# Patient Record
Sex: Male | Born: 1950
Health system: Southern US, Community
[De-identification: ages and names within clinical notes are randomized; demographics above are authoritative.]

## PROBLEM LIST (undated history)

## (undated) DIAGNOSIS — K439 Ventral hernia without obstruction or gangrene: Secondary | ICD-10-CM

## (undated) DIAGNOSIS — B351 Tinea unguium: Secondary | ICD-10-CM

## (undated) DIAGNOSIS — E785 Hyperlipidemia, unspecified: Secondary | ICD-10-CM

## (undated) DIAGNOSIS — I10 Essential (primary) hypertension: Secondary | ICD-10-CM

## (undated) DIAGNOSIS — M65311 Trigger thumb, right thumb: Secondary | ICD-10-CM

## (undated) DIAGNOSIS — G4733 Obstructive sleep apnea (adult) (pediatric): Secondary | ICD-10-CM

## (undated) DIAGNOSIS — S92309A Fracture of unspecified metatarsal bone(s), unspecified foot, initial encounter for closed fracture: Secondary | ICD-10-CM

## (undated) DIAGNOSIS — K219 Gastro-esophageal reflux disease without esophagitis: Secondary | ICD-10-CM

## (undated) DIAGNOSIS — Z9989 Dependence on other enabling machines and devices: Secondary | ICD-10-CM

## (undated) DIAGNOSIS — M1990 Unspecified osteoarthritis, unspecified site: Secondary | ICD-10-CM

## (undated) DIAGNOSIS — J309 Allergic rhinitis, unspecified: Secondary | ICD-10-CM

## (undated) DIAGNOSIS — R0789 Other chest pain: Secondary | ICD-10-CM

## (undated) DIAGNOSIS — I2510 Atherosclerotic heart disease of native coronary artery without angina pectoris: Secondary | ICD-10-CM

## (undated) HISTORY — DX: Gastro-esophageal reflux disease without esophagitis: K21.9

## (undated) HISTORY — DX: Ventral hernia without obstruction or gangrene: K43.9

## (undated) HISTORY — DX: Tinea unguium: B35.1

## (undated) HISTORY — DX: Hyperlipidemia, unspecified: E78.5

## (undated) HISTORY — DX: Essential (primary) hypertension: I10

## (undated) HISTORY — DX: Allergic rhinitis, unspecified: J30.9

## (undated) HISTORY — DX: Obstructive sleep apnea (adult) (pediatric): G47.33

## (undated) HISTORY — DX: Dependence on other enabling machines and devices: Z99.89

## (undated) HISTORY — DX: Trigger thumb, right thumb: M65.311

## (undated) HISTORY — DX: Unspecified osteoarthritis, unspecified site: M19.90

## (undated) HISTORY — PX: OTHER SURGICAL HISTORY: SHX169

## (undated) HISTORY — DX: Other chest pain: R07.89

## (undated) HISTORY — DX: Fracture of unspecified metatarsal bone(s), unspecified foot, initial encounter for closed fracture: S92.309A

---

## 2003-02-01 ENCOUNTER — Ambulatory Visit (HOSPITAL_BASED_OUTPATIENT_CLINIC_OR_DEPARTMENT_OTHER): Admission: RE | Admit: 2003-02-01 | Discharge: 2003-02-01 | Payer: Self-pay | Admitting: Internal Medicine

## 2009-09-01 ENCOUNTER — Ambulatory Visit (HOSPITAL_COMMUNITY): Admission: RE | Admit: 2009-09-01 | Discharge: 2009-09-01 | Payer: Self-pay | Admitting: Surgery

## 2010-01-18 DIAGNOSIS — S92309A Fracture of unspecified metatarsal bone(s), unspecified foot, initial encounter for closed fracture: Secondary | ICD-10-CM

## 2010-01-18 HISTORY — DX: Fracture of unspecified metatarsal bone(s), unspecified foot, initial encounter for closed fracture: S92.309A

## 2010-04-03 LAB — DIFFERENTIAL
Basophils Absolute: 0 10*3/uL (ref 0.0–0.1)
Basophils Relative: 0 % (ref 0–1)
Lymphocytes Relative: 25 % (ref 12–46)
Monocytes Absolute: 0.5 10*3/uL (ref 0.1–1.0)
Monocytes Relative: 8 % (ref 3–12)
Neutro Abs: 4.1 10*3/uL (ref 1.7–7.7)
Neutrophils Relative %: 61 % (ref 43–77)

## 2010-04-03 LAB — URINALYSIS, ROUTINE W REFLEX MICROSCOPIC
Bilirubin Urine: NEGATIVE
Nitrite: NEGATIVE
Specific Gravity, Urine: 1.012 (ref 1.005–1.030)
Urobilinogen, UA: 0.2 mg/dL (ref 0.0–1.0)
pH: 6 (ref 5.0–8.0)

## 2010-04-03 LAB — CBC
HCT: 42.1 % (ref 39.0–52.0)
Hemoglobin: 14.4 g/dL (ref 13.0–17.0)
RBC: 5.08 MIL/uL (ref 4.22–5.81)
WBC: 6.8 10*3/uL (ref 4.0–10.5)

## 2010-04-03 LAB — SURGICAL PCR SCREEN: MRSA, PCR: NEGATIVE

## 2010-04-03 LAB — BASIC METABOLIC PANEL
Calcium: 9.2 mg/dL (ref 8.4–10.5)
GFR calc Af Amer: >60 mL/min (ref >60)
GFR calc non Af Amer: >60 mL/min (ref >60)
Potassium: 4.1 mEq/L (ref 3.5–5.1)
Sodium: 141 mEq/L (ref 135–145)

## 2017-05-16 ENCOUNTER — Other Ambulatory Visit: Payer: Self-pay | Admitting: Internal Medicine

## 2017-05-16 DIAGNOSIS — R0789 Other chest pain: Secondary | ICD-10-CM

## 2017-05-17 ENCOUNTER — Ambulatory Visit (INDEPENDENT_AMBULATORY_CARE_PROVIDER_SITE_OTHER): Payer: BLUE CROSS/BLUE SHIELD | Admitting: Cardiology

## 2017-05-17 ENCOUNTER — Other Ambulatory Visit: Payer: BLUE CROSS/BLUE SHIELD

## 2017-05-17 ENCOUNTER — Encounter: Payer: Self-pay | Admitting: Cardiology

## 2017-05-17 ENCOUNTER — Ambulatory Visit (INDEPENDENT_AMBULATORY_CARE_PROVIDER_SITE_OTHER): Payer: BLUE CROSS/BLUE SHIELD

## 2017-05-17 ENCOUNTER — Telehealth: Payer: Self-pay

## 2017-05-17 DIAGNOSIS — E7800 Pure hypercholesterolemia, unspecified: Secondary | ICD-10-CM

## 2017-05-17 DIAGNOSIS — M65311 Trigger thumb, right thumb: Secondary | ICD-10-CM | POA: Insufficient documentation

## 2017-05-17 DIAGNOSIS — R9439 Abnormal result of other cardiovascular function study: Secondary | ICD-10-CM

## 2017-05-17 DIAGNOSIS — E785 Hyperlipidemia, unspecified: Secondary | ICD-10-CM | POA: Insufficient documentation

## 2017-05-17 DIAGNOSIS — G4733 Obstructive sleep apnea (adult) (pediatric): Secondary | ICD-10-CM | POA: Insufficient documentation

## 2017-05-17 DIAGNOSIS — Z9989 Dependence on other enabling machines and devices: Secondary | ICD-10-CM

## 2017-05-17 DIAGNOSIS — M1990 Unspecified osteoarthritis, unspecified site: Secondary | ICD-10-CM | POA: Insufficient documentation

## 2017-05-17 DIAGNOSIS — R0789 Other chest pain: Secondary | ICD-10-CM

## 2017-05-17 DIAGNOSIS — R079 Chest pain, unspecified: Secondary | ICD-10-CM | POA: Diagnosis not present

## 2017-05-17 DIAGNOSIS — K219 Gastro-esophageal reflux disease without esophagitis: Secondary | ICD-10-CM | POA: Insufficient documentation

## 2017-05-17 DIAGNOSIS — I10 Essential (primary) hypertension: Secondary | ICD-10-CM

## 2017-05-17 DIAGNOSIS — K439 Ventral hernia without obstruction or gangrene: Secondary | ICD-10-CM | POA: Insufficient documentation

## 2017-05-17 DIAGNOSIS — J309 Allergic rhinitis, unspecified: Secondary | ICD-10-CM | POA: Insufficient documentation

## 2017-05-17 LAB — EXERCISE TOLERANCE TEST
CHL CUP RESTING HR STRESS: 83 {beats}/min
Estimated workload: 10.1 METS
Exercise duration (min): 9 min
Exercise duration (sec): 0 s
MPHR: 155 {beats}/min
Peak HR: 144 {beats}/min
Percent HR: 92 %
RPE: 16

## 2017-05-17 NOTE — Progress Notes (Signed)
 Cardiology Office Note    Date:  05/17/2017   ID:  Turhan C Bozard, DOB 03/17/1950, MRN [id number]  PCP:  Gates, Robert, MD  Cardiologist:  Ziomara Birenbaum, MD   Chief Complaint  Patient presents with  . Chest Pain    abnormal stress test    History of Present Illness:  Tyrone Sherman is a 67 y.o. male who is being seen today for the evaluation of chest pain at the request of Gates, Robert, MD.  This is a very pleasant 67-year-old white male with a history of hyperlipidemia who presented to his PCP recently with complaints of intermittent chest pain.  He states the pain is a pressure in the middle of his chest with no radiation to his arms or neck and comes and goes at various times.  It can occur both exertional and nonexertional.  He denies any associated symptoms of nausea, diaphoresis or shortness of breath.  He has not really had any exertional shortness of breath either.  He does though have a very strong family history of coronary disease with both his parents having CAD requiring CABG as well as his siblings at an early age in life.  He denies any history of tobacco use.  He denies any PND, orthopnea, dizziness, palpitations, syncope, lower extremity edema.    Past Medical History:  Diagnosis Date  . Allergic rhinitis   . Benign essential hypertension   . DJD (degenerative joint disease)   . GERD (gastroesophageal reflux disease)   . Hyperlipidemia   . Obstructive sleep apnea on CPAP   . Trigger thumb of right hand   . Ventral hernia     Past Surgical History:  Procedure Laterality Date  . Incarcerated umbilical hernia repair    . Left metatarsal fracture repair    . Left varicocele repair 1973      Current Medications: Current Meds  Medication Sig  . ALPRAZolam (XANAX) 0.25 MG tablet Take 0.25 mg by mouth at bedtime as needed for anxiety.  . Ascorbic Acid (VITAMIN C) 100 MG tablet Take 100 mg by mouth daily.  . aspirin EC 81 MG tablet Take 81 mg by mouth daily.  .  Cholecalciferol (VITAMIN D3) 2000 units TABS Take 2,000 Units by mouth daily.  . citalopram (CELEXA) 40 MG tablet Take 40 mg by mouth daily.  . folic acid (FOLVITE) 1 MG tablet Take 1 mg by mouth daily.  . Garlic 500 MG CAPS Take 500 mg by mouth.  . losartan-hydrochlorothiazide (HYZAAR) 50-12.5 MG tablet Take 1 tablet by mouth daily.  . Omega 3 1200 MG CAPS Take 1,200 mg by mouth daily.  . omeprazole (PRILOSEC) 20 MG capsule Take 20 mg by mouth 2 (two) times daily before a meal.  . rosuvastatin (CRESTOR) 20 MG tablet Take 20 mg by mouth daily.  . vitamin E 100 UNIT capsule Take by mouth daily.    Allergies:   Lipitor [atorvastatin calcium]; Norvasc [amlodipine besylate]; and Prinivil [lisinopril]   Social History   Socioeconomic History  . Marital status: Married    Spouse name: Not on file  . Number of children: Not on file  . Years of education: Not on file  . Highest education level: Not on file  Occupational History  . Occupation: mortgage broker  Social Needs  . Financial resource strain: Not on file  . Food insecurity:    Worry: Not on file    Inability: Not on file  . Transportation needs:      Medical: Not on file    Non-medical: Not on file  Tobacco Use  . Smoking status: Never Smoker  Substance and Sexual Activity  . Alcohol use: Never    Frequency: Never  . Drug use: Never  . Sexual activity: Yes  Lifestyle  . Physical activity:    Days per week: Not on file    Minutes per session: Not on file  . Stress: Not on file  Relationships  . Social connections:    Talks on phone: Not on file    Gets together: Not on file    Attends religious service: Not on file    Active member of club or organization: Not on file    Attends meetings of clubs or organizations: Not on file    Relationship status: Not on file  Other Topics Concern  . Not on file  Social History Narrative  . Not on file     Family History:  The patient's family history includes CAD in his  brother, brother, brother, father, and mother; Hypertension in his brother, brother, and brother.   ROS:   Please see the history of present illness.    ROS All other systems reviewed and are negative.  No flowsheet data found.  PHYSICAL EXAM:   VS:  There were no vitals taken for this visit.   GEN: Well nourished, well developed, in no acute distress  HEENT: normal  Neck: no JVD, carotid bruits, or masses Cardiac: RRR; no murmurs, rubs, or gallops,no edema.  Intact distal pulses bilaterally.  Respiratory:  clear to auscultation bilaterally, normal work of breathing GI: soft, nontender, nondistended, + BS MS: no deformity or atrophy  Skin: warm and dry, no rash Neuro:  Alert and Oriented x 3, Strength and sensation are intact Psych: euthymic mood, full affect  Wt Readings from Last 3 Encounters:  No data found for Wt      Studies/Labs Reviewed:   EKG:  EKG is ordered today.  The ekg ordered today demonstrates NSR with no ST changes  Recent Labs: No results found for requested labs within last 8760 hours.   Lipid Panel No results found for: CHOL, TRIG, HDL, CHOLHDL, VLDL, LDLCALC, LDLDIRECT  Additional studies/ records that were reviewed today include:  Office notes from PCP    ASSESSMENT:    1. Chest pain, unspecified type   2. Pure hypercholesterolemia   3. Essential hypertension      PLAN:  In order of problems listed above:  1.  Chest pain -symptoms are worrisome for underlying coronary ischemia.  He has been getting chest pressure intermittently that can be exertional as well as nonexertional.  There is no radiation of the discomfort into his arms or neck and there is no associated symptoms of nausea, diaphoresis or shortness of breath.    Resting EKG shows sinus rhythm but he presented today for exercise treadmill test which showed at least 2 mm of horizontal ST segment depression in recovery with a drop in systolic blood pressure of over 20 mmHg.  He did  not have any chest pain during the stress test.    He has an extremely strong history of premature coronary disease in multiple family members.  This includes his mother, father and several brothers.  He also has a history of hyperlipidemia, obesity and hypertension.  Given abnormal exercise treadmill test which was consistent with coronary ischemia as well as multiple cardiac risk factors, I recommended proceeding with left heart catheterization.Cardiac catheterization was discussed with   the patient fully.   The patient understands that risks include but are not limited to stroke (1 in 1000), death (1 in 1000), kidney failure [usually temporary] (1 in 500), bleeding (1 in 200), allergic reaction [possibly serious] (1 in 200).  The patient understands and is willing to proceed.  This will be done Friday, May 3 with Dr. Varanasi.  We will continue to take an aspirin 81 mg daily.  We instructed him that if he develops any chest pain that does not resolve with rest that he is to go to the hospital immediately.  2.  Hypertension - blood pressure was controlled at time of stress test.  He will continue on Hyzaar 50-12.5 mg daily.  3.  Hyperlipidemia -he will continue on Crestor 20 mg daily.      Medication Adjustments/Labs and Tests Ordered: Current medicines are reviewed at length with the patient today.  Concerns regarding medicines are outlined above.  Medication changes, Labs and Tests ordered today are listed in the Patient Instructions below.  There are no Patient Instructions on file for this visit.   Signed, Cristol Engdahl, MD  05/17/2017 4:48 PM    [facility] [address], [place], [place]  [address] Phone: [phone]; Fax: [phone]   

## 2017-05-17 NOTE — H&P (View-Only) (Signed)
Cardiology Office Note    Date:  05/17/2017   ID:  JAYMON DUDEK, DOB 02/12/1950, MRN [id number]  PCP:  Marden Noble, MD  Cardiologist:  Armanda Magic, MD   Chief Complaint  Patient presents with  . Chest Pain    abnormal stress test    History of Present Illness:  Tyrone Sherman is a 67 y.o. male who is being seen today for the evaluation of chest pain at the request of Marden Noble, MD.  This is a very pleasant 67 year old white male with a history of hyperlipidemia who presented to his PCP recently with complaints of intermittent chest pain.  He states the pain is a pressure in the middle of his chest with no radiation to his arms or neck and comes and goes at various times.  It can occur both exertional and nonexertional.  He denies any associated symptoms of nausea, diaphoresis or shortness of breath.  He has not really had any exertional shortness of breath either.  He does though have a very strong family history of coronary disease with both his parents having CAD requiring CABG as well as his siblings at an early age in life.  He denies any history of tobacco use.  He denies any PND, orthopnea, dizziness, palpitations, syncope, lower extremity edema.    Past Medical History:  Diagnosis Date  . Allergic rhinitis   . Benign essential hypertension   . DJD (degenerative joint disease)   . GERD (gastroesophageal reflux disease)   . Hyperlipidemia   . Obstructive sleep apnea on CPAP   . Trigger thumb of right hand   . Ventral hernia     Past Surgical History:  Procedure Laterality Date  . Incarcerated umbilical hernia repair    . Left metatarsal fracture repair    . Left varicocele repair 1973      Current Medications: Current Meds  Medication Sig  . ALPRAZolam (XANAX) 0.25 MG tablet Take 0.25 mg by mouth at bedtime as needed for anxiety.  . Ascorbic Acid (VITAMIN C) 100 MG tablet Take 100 mg by mouth daily.  Marland Kitchen aspirin EC 81 MG tablet Take 81 mg by mouth daily.  .  Cholecalciferol (VITAMIN D3) 2000 units TABS Take 2,000 Units by mouth daily.  . citalopram (CELEXA) 40 MG tablet Take 40 mg by mouth daily.  . folic acid (FOLVITE) 1 MG tablet Take 1 mg by mouth daily.  . Garlic 500 MG CAPS Take 500 mg by mouth.  . losartan-hydrochlorothiazide (HYZAAR) 50-12.5 MG tablet Take 1 tablet by mouth daily.  . Omega 3 1200 MG CAPS Take 1,200 mg by mouth daily.  Marland Kitchen omeprazole (PRILOSEC) 20 MG capsule Take 20 mg by mouth 2 (two) times daily before a meal.  . rosuvastatin (CRESTOR) 20 MG tablet Take 20 mg by mouth daily.  . vitamin E 100 UNIT capsule Take by mouth daily.    Allergies:   Lipitor [atorvastatin calcium]; Norvasc [amlodipine besylate]; and Prinivil [lisinopril]   Social History   Socioeconomic History  . Marital status: Married    Spouse name: Not on file  . Number of children: Not on file  . Years of education: Not on file  . Highest education level: Not on file  Occupational History  . Occupation: Loss adjuster, chartered  . Financial resource strain: Not on file  . Food insecurity:    Worry: Not on file    Inability: Not on file  . Transportation needs:  Medical: Not on file    Non-medical: Not on file  Tobacco Use  . Smoking status: Never Smoker  Substance and Sexual Activity  . Alcohol use: Never    Frequency: Never  . Drug use: Never  . Sexual activity: Yes  Lifestyle  . Physical activity:    Days per week: Not on file    Minutes per session: Not on file  . Stress: Not on file  Relationships  . Social connections:    Talks on phone: Not on file    Gets together: Not on file    Attends religious service: Not on file    Active member of club or organization: Not on file    Attends meetings of clubs or organizations: Not on file    Relationship status: Not on file  Other Topics Concern  . Not on file  Social History Narrative  . Not on file     Family History:  The patient's family history includes CAD in his  brother, brother, brother, father, and mother; Hypertension in his brother, brother, and brother.   ROS:   Please see the history of present illness.    ROS All other systems reviewed and are negative.  No flowsheet data found.  PHYSICAL EXAM:   VS:  There were no vitals taken for this visit.   GEN: Well nourished, well developed, in no acute distress  HEENT: normal  Neck: no JVD, carotid bruits, or masses Cardiac: RRR; no murmurs, rubs, or gallops,no edema.  Intact distal pulses bilaterally.  Respiratory:  clear to auscultation bilaterally, normal work of breathing GI: soft, nontender, nondistended, + BS MS: no deformity or atrophy  Skin: warm and dry, no rash Neuro:  Alert and Oriented x 3, Strength and sensation are intact Psych: euthymic mood, full affect  Wt Readings from Last 3 Encounters:  No data found for Wt      Studies/Labs Reviewed:   EKG:  EKG is ordered today.  The ekg ordered today demonstrates NSR with no ST changes  Recent Labs: No results found for requested labs within last 8760 hours.   Lipid Panel No results found for: CHOL, TRIG, HDL, CHOLHDL, VLDL, LDLCALC, LDLDIRECT  Additional studies/ records that were reviewed today include:  Office notes from PCP    ASSESSMENT:    1. Chest pain, unspecified type   2. Pure hypercholesterolemia   3. Essential hypertension      PLAN:  In order of problems listed above:  1.  Chest pain -symptoms are worrisome for underlying coronary ischemia.  He has been getting chest pressure intermittently that can be exertional as well as nonexertional.  There is no radiation of the discomfort into his arms or neck and there is no associated symptoms of nausea, diaphoresis or shortness of breath.    Resting EKG shows sinus rhythm but he presented today for exercise treadmill test which showed at least 2 mm of horizontal ST segment depression in recovery with a drop in systolic blood pressure of over 20 mmHg.  He did  not have any chest pain during the stress test.    He has an extremely strong history of premature coronary disease in multiple family members.  This includes his mother, father and several brothers.  He also has a history of hyperlipidemia, obesity and hypertension.  Given abnormal exercise treadmill test which was consistent with coronary ischemia as well as multiple cardiac risk factors, I recommended proceeding with left heart catheterization.Cardiac catheterization was discussed with  the patient fully.   The patient understands that risks include but are not limited to stroke (1 in 1000), death (1 in 1000), kidney failure [usually temporary] (1 in 500), bleeding (1 in 200), allergic reaction [possibly serious] (1 in 200).  The patient understands and is willing to proceed.  This will be done Friday, May 3 with Dr. Eldridge Dace.  We will continue to take an aspirin 81 mg daily.  We instructed him that if he develops any chest pain that does not resolve with rest that he is to go to the hospital immediately.  2.  Hypertension - blood pressure was controlled at time of stress test.  He will continue on Hyzaar 50-12.5 mg daily.  3.  Hyperlipidemia -he will continue on Crestor 20 mg daily.      Medication Adjustments/Labs and Tests Ordered: Current medicines are reviewed at length with the patient today.  Concerns regarding medicines are outlined above.  Medication changes, Labs and Tests ordered today are listed in the Patient Instructions below.  There are no Patient Instructions on file for this visit.   Signed, Armanda Magic, MD  05/17/2017 4:48 PM    [facility] [address] [place], [place], [place]  [address] Phone: [phone]; Fax: [phone]

## 2017-05-17 NOTE — Telephone Encounter (Signed)
Patient understood instructions (including when to proceed to ED) when he was in the office.

## 2017-05-17 NOTE — Telephone Encounter (Addendum)
Patient in today for GXT. Per Dr. Mayford Knife, DOD, patient is to be scheduled for L heart catheterization for abnormal GXT. Labs to be drawn today.  Scheduled patient Friday, 5/3 with Dr. Eldridge Dace. Instructions reviewed with patient and letter given. He understands to proceed to ER prior to cath time if CP occurs.  Records requested, received and given to Chart Prep.

## 2017-05-17 NOTE — Telephone Encounter (Signed)
Please instruct patient that if he has any further chest pain that does not resolve with rest that he is to go immediately to [facility] emergency room.

## 2017-05-18 ENCOUNTER — Telehealth: Payer: Self-pay | Admitting: *Deleted

## 2017-05-18 ENCOUNTER — Encounter: Payer: Self-pay | Admitting: Cardiology

## 2017-05-18 LAB — BASIC METABOLIC PANEL
BUN / CREAT RATIO: 16 (ref 10–24)
BUN: 17 mg/dL (ref 8–27)
CO2: 23 mmol/L (ref 20–29)
Calcium: 9.8 mg/dL (ref 8.6–10.2)
Chloride: 104 mmol/L (ref 96–106)
Creatinine, Ser: 1.06 mg/dL (ref 0.76–1.27)
GFR, EST AFRICAN AMERICAN: 85 mL/min/{1.73_m2} (ref >59)
GFR, EST NON AFRICAN AMERICAN: 73 mL/min/{1.73_m2} (ref >59)
Glucose: 98 mg/dL (ref 65–99)
Potassium: 4.7 mmol/L (ref 3.5–5.2)
Sodium: 142 mmol/L (ref 134–144)

## 2017-05-18 LAB — CBC WITH DIFFERENTIAL/PLATELET
BASOS ABS: 0 10*3/uL (ref 0.0–0.2)
BASOS: 0 %
EOS (ABSOLUTE): 0.5 10*3/uL — ABNORMAL HIGH (ref 0.0–0.4)
Eos: 5 %
HEMOGLOBIN: 14.3 g/dL (ref 13.0–17.7)
Hematocrit: 43.9 % (ref 37.5–51.0)
IMMATURE GRANS (ABS): 0 10*3/uL (ref 0.0–0.1)
Immature Granulocytes: 0 %
LYMPHS ABS: 1.6 10*3/uL (ref 0.7–3.1)
Lymphs: 19 %
MCH: 27.4 pg (ref 26.6–33.0)
MCHC: 32.6 g/dL (ref 31.5–35.7)
MCV: 84 fL (ref 79–97)
MONOCYTES: 7 %
Monocytes Absolute: 0.6 10*3/uL (ref 0.1–0.9)
NEUTROS ABS: 5.8 10*3/uL (ref 1.4–7.0)
Neutrophils: 69 %
Platelets: 301 10*3/uL (ref 150–379)
RBC: 5.22 x10E6/uL (ref 4.14–5.80)
RDW: 14.5 % (ref 12.3–15.4)
WBC: 8.6 10*3/uL (ref 3.4–10.8)

## 2017-05-18 NOTE — Telephone Encounter (Signed)
NOTES ON FILE FROM [place] [facility] DR. ROBERT GATES.

## 2017-05-19 ENCOUNTER — Telehealth: Payer: Self-pay | Admitting: Cardiology

## 2017-05-19 ENCOUNTER — Telehealth: Payer: Self-pay | Admitting: *Deleted

## 2017-05-19 NOTE — Telephone Encounter (Signed)
New message  Pt verbalized that he is returning call for RN  For his lab results

## 2017-05-19 NOTE — Telephone Encounter (Signed)
Returned call to patient and made him aware of normal lab results. Patient verbalized understanding and thankful for the call

## 2017-05-19 NOTE — Telephone Encounter (Signed)
Pt contacted pre-catheterization scheduled at [facility] for: Friday May 3,2019 10:30 AM Verified arrival time and place: [facility] Main Entrance A at: 8 AM  No solid food after midnight prior to cath, clear liquids until 5 AM Verified allergies in Epic Verified no diabetes medications.  Hold: Losartan-HCTZ AM of cath  Except hold medications AM meds can be  taken pre-cath with sip of water including: ASA 81 mg  Confirmed patient has responsible person to drive home post procedure and observe patient for 24 hours: yes

## 2017-05-20 ENCOUNTER — Other Ambulatory Visit: Payer: Self-pay

## 2017-05-20 ENCOUNTER — Encounter (HOSPITAL_COMMUNITY)
Admission: AD | Disposition: A | Discharge status: Home / Self Care | Payer: Self-pay | Source: Ambulatory Visit | Attending: Cardiology

## 2017-05-20 ENCOUNTER — Encounter (HOSPITAL_COMMUNITY): Payer: Self-pay | Admitting: Interventional Cardiology

## 2017-05-20 ENCOUNTER — Inpatient Hospital Stay (HOSPITAL_COMMUNITY)
Admission: AD | Admit: 2017-05-20 | Discharge: 2017-05-28 | DRG: 236 | Disposition: A | Discharge status: Home / Self Care | Payer: BLUE CROSS/BLUE SHIELD | Source: Ambulatory Visit | Attending: Cardiothoracic Surgery | Admitting: Cardiothoracic Surgery

## 2017-05-20 ENCOUNTER — Other Ambulatory Visit: Payer: Self-pay | Admitting: *Deleted

## 2017-05-20 DIAGNOSIS — G4733 Obstructive sleep apnea (adult) (pediatric): Secondary | ICD-10-CM | POA: Diagnosis present

## 2017-05-20 DIAGNOSIS — E7800 Pure hypercholesterolemia, unspecified: Secondary | ICD-10-CM | POA: Diagnosis not present

## 2017-05-20 DIAGNOSIS — I2583 Coronary atherosclerosis due to lipid rich plaque: Secondary | ICD-10-CM | POA: Diagnosis present

## 2017-05-20 DIAGNOSIS — M1990 Unspecified osteoarthritis, unspecified site: Secondary | ICD-10-CM | POA: Diagnosis present

## 2017-05-20 DIAGNOSIS — R9439 Abnormal result of other cardiovascular function study: Secondary | ICD-10-CM | POA: Diagnosis not present

## 2017-05-20 DIAGNOSIS — E669 Obesity, unspecified: Secondary | ICD-10-CM | POA: Diagnosis present

## 2017-05-20 DIAGNOSIS — Z6829 Body mass index (BMI) 29.0-29.9, adult: Secondary | ICD-10-CM | POA: Diagnosis not present

## 2017-05-20 DIAGNOSIS — D62 Acute posthemorrhagic anemia: Secondary | ICD-10-CM | POA: Diagnosis not present

## 2017-05-20 DIAGNOSIS — I2510 Atherosclerotic heart disease of native coronary artery without angina pectoris: Secondary | ICD-10-CM

## 2017-05-20 DIAGNOSIS — K219 Gastro-esophageal reflux disease without esophagitis: Secondary | ICD-10-CM | POA: Diagnosis present

## 2017-05-20 DIAGNOSIS — Z8249 Family history of ischemic heart disease and other diseases of the circulatory system: Secondary | ICD-10-CM | POA: Diagnosis not present

## 2017-05-20 DIAGNOSIS — E8770 Fluid overload, unspecified: Secondary | ICD-10-CM | POA: Diagnosis not present

## 2017-05-20 DIAGNOSIS — I209 Angina pectoris, unspecified: Secondary | ICD-10-CM | POA: Diagnosis not present

## 2017-05-20 DIAGNOSIS — I25110 Atherosclerotic heart disease of native coronary artery with unstable angina pectoris: Principal | ICD-10-CM | POA: Diagnosis present

## 2017-05-20 DIAGNOSIS — I10 Essential (primary) hypertension: Secondary | ICD-10-CM

## 2017-05-20 DIAGNOSIS — E785 Hyperlipidemia, unspecified: Secondary | ICD-10-CM | POA: Diagnosis present

## 2017-05-20 DIAGNOSIS — Z951 Presence of aortocoronary bypass graft: Secondary | ICD-10-CM

## 2017-05-20 DIAGNOSIS — Z09 Encounter for follow-up examination after completed treatment for conditions other than malignant neoplasm: Secondary | ICD-10-CM

## 2017-05-20 DIAGNOSIS — I361 Nonrheumatic tricuspid (valve) insufficiency: Secondary | ICD-10-CM | POA: Diagnosis not present

## 2017-05-20 DIAGNOSIS — Z01810 Encounter for preprocedural cardiovascular examination: Secondary | ICD-10-CM | POA: Diagnosis not present

## 2017-05-20 DIAGNOSIS — R079 Chest pain, unspecified: Secondary | ICD-10-CM | POA: Diagnosis present

## 2017-05-20 DIAGNOSIS — I200 Unstable angina: Secondary | ICD-10-CM | POA: Diagnosis not present

## 2017-05-20 HISTORY — PX: LEFT HEART CATH AND CORONARY ANGIOGRAPHY: CATH118249

## 2017-05-20 LAB — SURGICAL PCR SCREEN
MRSA, PCR: NEGATIVE
Staphylococcus aureus: NEGATIVE

## 2017-05-20 LAB — URINALYSIS, ROUTINE W REFLEX MICROSCOPIC
Bilirubin Urine: NEGATIVE
Glucose, UA: NEGATIVE mg/dL
Hgb urine dipstick: NEGATIVE
Ketones, ur: NEGATIVE mg/dL
Leukocytes, UA: NEGATIVE
Nitrite: NEGATIVE
Protein, ur: NEGATIVE mg/dL
Specific Gravity, Urine: 1.018 (ref 1.005–1.030)
pH: 7 (ref 5.0–8.0)

## 2017-05-20 LAB — CBC
HCT: 42.9 % (ref 39.0–52.0)
Hemoglobin: 14.2 g/dL (ref 13.0–17.0)
MCH: 27.9 pg (ref 26.0–34.0)
MCHC: 33.1 g/dL (ref 30.0–36.0)
MCV: 84.3 fL (ref 78.0–100.0)
PLATELETS: 249 10*3/uL (ref 150–400)
RBC: 5.09 MIL/uL (ref 4.22–5.81)
RDW: 14.4 % (ref 11.5–15.5)
WBC: 6 10*3/uL (ref 4.0–10.5)

## 2017-05-20 SURGERY — LEFT HEART CATH AND CORONARY ANGIOGRAPHY
Anesthesia: LOCAL

## 2017-05-20 MED ORDER — ONDANSETRON HCL 4 MG/2ML IJ SOLN: 4.0000 mg | Freq: Four times a day (QID) | INTRAMUSCULAR | Status: DC | PRN

## 2017-05-20 MED ORDER — MIDAZOLAM HCL 2 MG/2ML IJ SOLN
INTRAMUSCULAR | Status: DC | PRN
  Administered 2017-05-20: 2 mg via INTRAVENOUS

## 2017-05-20 MED ORDER — ACETAMINOPHEN 325 MG PO TABS: 650.0000 mg | ORAL_TABLET | ORAL | Status: DC | PRN

## 2017-05-20 MED ORDER — ALPRAZOLAM 0.25 MG PO TABS: 0.2500 mg | ORAL_TABLET | Freq: Every evening | ORAL | Status: DC | PRN

## 2017-05-20 MED ORDER — VERAPAMIL HCL 2.5 MG/ML IV SOLN
INTRAVENOUS | Status: AC
  Filled 2017-05-20: qty 2

## 2017-05-20 MED ORDER — HYDROCHLOROTHIAZIDE 12.5 MG PO CAPS
12.5000 mg | ORAL_CAPSULE | Freq: Every day | ORAL | Status: DC
  Administered 2017-05-20 – 2017-05-23 (×4): 12.5 mg via ORAL
  Filled 2017-05-20 (×6): qty 1

## 2017-05-20 MED ORDER — LOSARTAN POTASSIUM-HCTZ 50-12.5 MG PO TABS: 1.0000 | ORAL_TABLET | Freq: Every day | ORAL | Status: DC

## 2017-05-20 MED ORDER — LIDOCAINE HCL (PF) 1 % IJ SOLN
INTRAMUSCULAR | Status: AC
  Filled 2017-05-20: qty 30

## 2017-05-20 MED ORDER — LOSARTAN POTASSIUM 50 MG PO TABS
50.0000 mg | ORAL_TABLET | Freq: Every day | ORAL | Status: DC
  Administered 2017-05-20 – 2017-05-23 (×4): 50 mg via ORAL
  Filled 2017-05-20 (×3): qty 1

## 2017-05-20 MED ORDER — HEPARIN SODIUM (PORCINE) 1000 UNIT/ML IJ SOLN
INTRAMUSCULAR | Status: AC
Start: 2017-05-20 — End: ?
  Filled 2017-05-20: qty 1

## 2017-05-20 MED ORDER — ROSUVASTATIN CALCIUM 20 MG PO TABS
20.0000 mg | ORAL_TABLET | Freq: Every day | ORAL | Status: DC
  Administered 2017-05-21 – 2017-05-22 (×2): 20 mg via ORAL
  Filled 2017-05-20 (×2): qty 1

## 2017-05-20 MED ORDER — SODIUM CHLORIDE 0.9 % WEIGHT BASED INFUSION
3.0000 mL/kg/h | INTRAVENOUS | Status: DC
  Administered 2017-05-20: 3 mL/kg/h via INTRAVENOUS

## 2017-05-20 MED ORDER — PNEUMOCOCCAL VAC POLYVALENT 25 MCG/0.5ML IJ INJ
0.5000 mL | INJECTION | INTRAMUSCULAR | Status: DC
  Filled 2017-05-20: qty 0.5

## 2017-05-20 MED ORDER — LIDOCAINE HCL (PF) 1 % IJ SOLN
INTRAMUSCULAR | Status: DC | PRN
  Administered 2017-05-20: 2 mL

## 2017-05-20 MED ORDER — PANTOPRAZOLE SODIUM 40 MG PO TBEC
40.0000 mg | DELAYED_RELEASE_TABLET | Freq: Every day | ORAL | Status: DC
  Administered 2017-05-21 – 2017-05-28 (×7): 40 mg via ORAL
  Filled 2017-05-20 (×7): qty 1

## 2017-05-20 MED ORDER — SODIUM CHLORIDE 0.9% FLUSH: 3.0000 mL | INTRAVENOUS | Status: DC | PRN

## 2017-05-20 MED ORDER — ASPIRIN EC 81 MG PO TBEC
81.0000 mg | DELAYED_RELEASE_TABLET | Freq: Every day | ORAL | Status: DC
  Administered 2017-05-21 – 2017-05-23 (×3): 81 mg via ORAL
  Filled 2017-05-20 (×3): qty 1

## 2017-05-20 MED ORDER — SODIUM CHLORIDE 0.9 % IV SOLN: INTRAVENOUS | Status: AC

## 2017-05-20 MED ORDER — SODIUM CHLORIDE 0.9 % WEIGHT BASED INFUSION: 1.0000 mL/kg/h | INTRAVENOUS | Status: DC

## 2017-05-20 MED ORDER — HEPARIN SODIUM (PORCINE) 1000 UNIT/ML IJ SOLN
INTRAMUSCULAR | Status: DC | PRN
  Administered 2017-05-20: 5000 [IU] via INTRAVENOUS

## 2017-05-20 MED ORDER — SODIUM CHLORIDE 0.9 % IV SOLN: 250.0000 mL | INTRAVENOUS | Status: DC | PRN

## 2017-05-20 MED ORDER — MIDAZOLAM HCL 2 MG/2ML IJ SOLN
INTRAMUSCULAR | Status: AC
  Filled 2017-05-20: qty 2

## 2017-05-20 MED ORDER — ASPIRIN 81 MG PO CHEW: 81.0000 mg | CHEWABLE_TABLET | ORAL | Status: DC

## 2017-05-20 MED ORDER — ASPIRIN 81 MG PO CHEW: 81.0000 mg | CHEWABLE_TABLET | Freq: Every day | ORAL | Status: DC

## 2017-05-20 MED ORDER — FENTANYL CITRATE (PF) 100 MCG/2ML IJ SOLN
INTRAMUSCULAR | Status: AC
  Filled 2017-05-20: qty 2

## 2017-05-20 MED ORDER — IOHEXOL 350 MG/ML SOLN
INTRAVENOUS | Status: DC | PRN
  Administered 2017-05-20: 70 mL via INTRA_ARTERIAL

## 2017-05-20 MED ORDER — SODIUM CHLORIDE 0.9% FLUSH
3.0000 mL | Freq: Two times a day (BID) | INTRAVENOUS | Status: DC
  Administered 2017-05-20 – 2017-05-23 (×4): 3 mL via INTRAVENOUS

## 2017-05-20 MED ORDER — CITALOPRAM HYDROBROMIDE 20 MG PO TABS
40.0000 mg | ORAL_TABLET | Freq: Every day | ORAL | Status: DC
  Administered 2017-05-21 – 2017-05-28 (×7): 40 mg via ORAL
  Filled 2017-05-20 (×8): qty 2

## 2017-05-20 MED ORDER — HEPARIN (PORCINE) IN NACL 100-0.45 UNIT/ML-% IJ SOLN
1400.0000 [IU]/h | INTRAMUSCULAR | Status: DC
  Administered 2017-05-20: 1200 [IU]/h via INTRAVENOUS
  Administered 2017-05-21 – 2017-05-22 (×3): 1550 [IU]/h via INTRAVENOUS
  Administered 2017-05-23: 1400 [IU]/h via INTRAVENOUS
  Filled 2017-05-20 (×5): qty 250

## 2017-05-20 MED ORDER — SODIUM CHLORIDE 0.9% FLUSH
3.0000 mL | Freq: Two times a day (BID) | INTRAVENOUS | Status: DC
  Administered 2017-05-20: 3 mL via INTRAVENOUS

## 2017-05-20 MED ORDER — FENTANYL CITRATE (PF) 100 MCG/2ML IJ SOLN
INTRAMUSCULAR | Status: DC | PRN
  Administered 2017-05-20: 25 ug via INTRAVENOUS

## 2017-05-20 MED ORDER — HEPARIN (PORCINE) IN NACL 1000-0.9 UT/500ML-% IV SOLN
INTRAVENOUS | Status: AC
Start: 2017-05-20 — End: ?
  Filled 2017-05-20: qty 1000

## 2017-05-20 MED ORDER — HEPARIN (PORCINE) IN NACL 2-0.9 UNIT/ML-% IJ SOLN
INTRAMUSCULAR | Status: DC | PRN
  Administered 2017-05-20: 11:00:00 via INTRA_ARTERIAL

## 2017-05-20 MED ORDER — HEPARIN (PORCINE) IN NACL 2-0.9 UNITS/ML
INTRAMUSCULAR | Status: AC | PRN
  Administered 2017-05-20 (×2): 500 mL via INTRA_ARTERIAL

## 2017-05-20 SURGICAL SUPPLY — 10 items
CATH INFINITI 5 FR JL3.5 (CATHETERS) ×2 IMPLANT
CATH INFINITI JR4 5F (CATHETERS) ×2 IMPLANT
DEVICE RAD COMP TR BAND LRG (VASCULAR PRODUCTS) ×2 IMPLANT
GUIDEWIRE INQWIRE 1.5J.035X260 (WIRE) ×1 IMPLANT
INQWIRE 1.5J .035X260CM (WIRE) ×2
KIT HEART LEFT (KITS) ×2 IMPLANT
PACK CARDIAC CATHETERIZATION (CUSTOM PROCEDURE TRAY) ×2 IMPLANT
SHEATH RAIN RADIAL 21G 6FR (SHEATH) ×2 IMPLANT
TRANSDUCER W/STOPCOCK (MISCELLANEOUS) ×2 IMPLANT
TUBING CIL FLEX 10 FLL-RA (TUBING) ×2 IMPLANT

## 2017-05-20 NOTE — Consult Note (Signed)
[address]       [place] [address]             [phone]        FREMAN LAPAGE [facility] Medical Record #[id number] Date of Birth: [birth year]  Referring  Dr Kym Groom Primary Care: Marden Noble, MD Primary Cardiologist:Dr T Turner  Chief Complaint:   Chest pain on exertion and at rest- acute coronary syndrome  History of Present Illness:     Very nice 67 year old Caucasian male presents with symptoms of exertional and at rest chest pain.  Pain is a pressure-like feeling in the center of the chest without radiation and without associated symptoms of nausea or neck and arm pain.  Pain resolves with rest.  Patient's risk factors include hyperlipidemia and positive family history[both parents and several brothers have had bypass surgery].  Patient underwent stress test which demonstrated ST segment changes diffusely and a drop in systolic blood pressure.  Cardiac catheterization performed today via right radial artery by Dr. Eldridge Dace demonstrates severe three-vessel coronary artery disease, LVEDP of 12, normal LVEF.  Surgical coronary catheterization has been recommended by the patient's cardiologist.  The patient is being admitted and placed on IV heparin until surgery which is scheduled for Tuesday, May 7 by Dr. Sheliah Plane.  The patient has been in good health.  He is a non-smoker.  He denies diabetes.  He walks on a treadmill for 20 minutes each a.m.  He has not been hospitalized for over 30 years.  No major surgery.  No history of thoracic trauma or pneumothorax.  He does have sleep apnea  Current Activity/ Functional Status: Patient lives with his wife and is actively employed as a Production manager   Zubrod Score: At the time of surgery this patient's most appropriate activity status/level should be described as:     0    Normal activity, no symptoms     1    Restricted in physical strenuous activity but ambulatory, able to do out light  work     2    Ambulatory and capable of self care, unable to do work activities, up and about                 more than 50%  Of the time                                3    Only limited self care, in bed greater than 50% of waking hours     4    Completely disabled, no self care, confined to bed or chair     5    Moribund  Past Medical History:  Diagnosis Date  . Allergic rhinitis   . Benign essential hypertension   . Chest tightness    EXERTIONAL  . DJD (degenerative joint disease)   . GERD (gastroesophageal reflux disease)   . Hyperlipidemia   . Hypertension   . Metatarsal fracture 2012   LEFT FOOT  . Obstructive sleep apnea on CPAP   . Onychomycosis   . Trigger thumb of right hand   . Ventral hernia   . Ventral hernia     Past Surgical History:  Procedure Laterality Date  . Incarcerated umbilical hernia repair    . LEFT HEART CATH AND CORONARY ANGIOGRAPHY N/A 05/20/2017   Procedure: LEFT HEART CATH AND CORONARY ANGIOGRAPHY;  Surgeon:  Corky Crafts, MD;  Location: [facility] INVASIVE CV LAB;  Service: Cardiovascular;  Laterality: N/A;  . Left metatarsal fracture repair    . Left varicocele repair 1973      Social History   Tobacco Use  Smoking Status Never Smoker  Smokeless Tobacco Never Used    Social History   Substance and Sexual Activity  Alcohol Use Never  . Frequency: Never    Social History   Socioeconomic History  . Marital status: Married    Spouse name: Not on file  . Number of children: Not on file  . Years of education: Not on file  . Highest education level: Not on file  Occupational History  . Occupation: Loss adjuster, chartered  . Financial resource strain: Not on file  . Food insecurity:    Worry: Not on file    Inability: Not on file  . Transportation needs:    Medical: Not on file    Non-medical: Not on file  Tobacco Use  . Smoking status: Never Smoker  . Smokeless tobacco: Never Used  Substance and Sexual Activity  .  Alcohol use: Never    Frequency: Never  . Drug use: Never  . Sexual activity: Yes  Lifestyle  . Physical activity:    Days per week: Not on file    Minutes per session: Not on file  . Stress: Not on file  Relationships  . Social connections:    Talks on phone: Not on file    Gets together: Not on file    Attends religious service: Not on file    Active member of club or organization: Not on file    Attends meetings of clubs or organizations: Not on file    Relationship status: Not on file  . Intimate partner violence:    Fear of current or ex partner: Not on file    Emotionally abused: Not on file    Physically abused: Not on file    Forced sexual activity: Not on file  Other Topics Concern  . Not on file  Social History Narrative  . Not on file    Allergies  Allergen Reactions  . Lipitor [Atorvastatin Calcium]   . Norvasc [Amlodipine Besylate]   . Prinivil [Lisinopril]     Current Facility-Administered Medications  Medication Dose Route Frequency Provider Last Rate Last Dose  . 0.9 %  sodium chloride infusion  250 mL Intravenous PRN Corky Crafts, MD      . acetaminophen (TYLENOL) tablet 650 mg  650 mg Oral Q4H PRN Corky Crafts, MD      . ALPRAZolam Prudy Feeler) tablet 0.25 mg  0.25 mg Oral QHS PRN Corky Crafts, MD      . Melene Muller ON 05/21/2017] aspirin EC tablet 81 mg  81 mg Oral Daily Corky Crafts, MD      . Melene Muller ON 05/21/2017] citalopram (CELEXA) tablet 40 mg  40 mg Oral Daily Corky Crafts, MD      . heparin ADULT infusion 100 units/mL (25000 units/258mL sodium chloride 0.45%)  1,200 Units/hr Intravenous Continuous Pierce, Dwayne A, RPH      . hydrochlorothiazide (MICROZIDE) capsule 12.5 mg  12.5 mg Oral Daily Armanda Magic R, MD   12.5 mg at 05/20/17 1512  . losartan (COZAAR) tablet 50 mg  50 mg Oral Daily Quintella Reichert, MD   50 mg at 05/20/17 1513  . ondansetron (ZOFRAN) injection 4 mg  4 mg Intravenous Q6H PRN Eldridge Dace,  Donnie Coffin,  MD      . Melene Muller ON 05/21/2017] pantoprazole (PROTONIX) EC tablet 40 mg  40 mg Oral Daily Corky Crafts, MD      . Melene Muller ON 05/21/2017] rosuvastatin (CRESTOR) tablet 20 mg  20 mg Oral Daily Corky Crafts, MD      . sodium chloride flush (NS) 0.9 % injection 3 mL  3 mL Intravenous Q12H Lance Muss S, MD      . sodium chloride flush (NS) 0.9 % injection 3 mL  3 mL Intravenous PRN Corky Crafts, MD        Medications Prior to Admission  Medication Sig Dispense Refill Last Dose  . ALPRAZolam (XANAX) 0.25 MG tablet Take 0.25 mg by mouth at bedtime as needed for anxiety.   unknown  . Ascorbic Acid (VITAMIN C) 100 MG tablet Take 100 mg by mouth daily.   05/19/2017 at Unknown time  . aspirin EC 81 MG tablet Take 81 mg by mouth daily.   05/20/2017 at 0700  . Cholecalciferol (VITAMIN D3) 2000 units TABS Take 2,000 Units by mouth daily.   05/19/2017 at Unknown time  . citalopram (CELEXA) 40 MG tablet Take 40 mg by mouth daily.   05/20/2017 at Unknown time  . folic acid (FOLVITE) 1 MG tablet Take 1 mg by mouth daily.   05/19/2017 at Unknown time  . Garlic 500 MG CAPS Take 500 mg by mouth.   05/19/2017 at Unknown time  . losartan-hydrochlorothiazide (HYZAAR) 50-12.5 MG tablet Take 1 tablet by mouth daily.   05/19/2017 at am  . Omega 3 1200 MG CAPS Take 1,200 mg by mouth daily.   05/19/2017 at Unknown time  . omeprazole (PRILOSEC) 20 MG capsule Take 20 mg by mouth 2 (two) times daily before a meal.   05/20/2017 at Unknown time  . rosuvastatin (CRESTOR) 20 MG tablet Take 20 mg by mouth daily.   05/20/2017 at Unknown time  . vitamin E 100 UNIT capsule Take 100 Units by mouth daily.    05/20/2017 at Unknown time    Family History  Problem Relation Age of Onset  . CAD Mother   . Other Mother        CABG x8 VESSELS  . CAD Father   . Other Father        7 VESSEL BYPASS   . Hypertension Brother 61  . CAD Brother   . Hypertension Brother 54  . CAD Brother   . Hypertension Brother 73  . CAD Brother        Review of Systems:   ROS Left-hand-dominant Superficial spider varicose veins over  right lower leg but no history of phlebitis     Cardiac Review of Systems: Y or  [    ]= no  Chest Pain [  y  ]  Resting SOB [   ] Exertional SOB  [  ]  Orthopnea [  ]   Pedal Edema [   ]    Palpitations [  ] Syncope  [  ]   Presyncope [   ]  General Review of Systems: [Y] = yes [  ]=no Constitional: recent weight change [  ]; anorexia [  ]; fatigue [  ]; nausea [  ]; night sweats [  ]; fever [  ]; or chills [  ]  Dental: poor dentition[  ]; Last Dentist visit: One year  Eye : blurred vision [  ]; diplopia [   ]; vision changes [  ];  Amaurosis fugax[  ]; Resp: cough [  ];  wheezing[  ];  hemoptysis[  ]; shortness of breath[  ]; paroxysmal nocturnal dyspnea[  ]; dyspnea on exertion[  ]; or orthopnea[  ];  GI:  gallstones[  ], vomiting[  ];  dysphagia[  ]; melena[  ];  hematochezia [  ]; heartburn[  ];   Hx of  Colonoscopy[  ]; GU: kidney stones [  ]; hematuria[  ];   dysuria [  ];  nocturia[  ];  history of     obstruction [  ]; urinary frequency [  ]             Skin: rash, swelling[  ];, hair loss[  ];  peripheral edema[  ];  or itching[  ]; Musculosketetal: myalgias[  ];  joint swelling[  ];  joint erythema[  ];  joint pain[  ];  back pain[  ];  Heme/Lymph: bruising[  ];  bleeding[  ];  anemia[  ];  Neuro: TIA[  ];  headaches[  ];  stroke[  ];  vertigo[  ];  seizures[  ];   paresthesias[  ];  difficulty walking[  ];  Psych:depression[  ]; anxiety[  ];  Endocrine: diabetes[  ];  thyroid dysfunction[  ];  Immunizations: Flu [  ]; Pneumococcal[  ];    Physical Exam: BP 140/78 (BP Location: Left Arm)   Pulse 81   Temp 98.6 F (37 C) (Oral)   Resp 20   Ht  (1.778 m)   Wt 208 lb (94.3 kg)   SpO2 95%   BMI 29.84 kg/m       Physical Exam  General: Well-nourished middle-aged male no acute distress HEENT: Normocephalic  pupils equal , dentition adequate Neck: Supple without JVD, adenopathy, or bruit Chest: Clear to auscultation, symmetrical breath sounds, no rhonchi, no tenderness             or deformity Cardiovascular: Regular rate and rhythm, no murmur, no gallop, peripheral pulses             palpable in all extremities Abdomen:  Soft, nontender, no palpable mass or organomegaly Extremities: Warm, well-perfused, no clubbing cyanosis edema or tenderness, no bleeding from right radial artery cath site, no venous stasis changes of the legs but small spider veins in right lower leg Rectal/GU: Deferred Neuro: Grossly non--focal and symmetrical throughout Skin: Clean and dry without rash or ulceration    Diagnostic Studies & Laboratory data:     Recent Radiology Findings:   No results found.  Chest x-ray pending   I have independently reviewed the above radiologic studies.  Recent Lab Findings: Lab Results  Component Value Date   WBC 6.0 05/20/2017   HGB 14.2 05/20/2017   HCT 42.9 05/20/2017   PLT 249 05/20/2017   GLUCOSE 98 05/17/2017   NA 142 05/17/2017   K 4.7 05/17/2017   CL 104 05/17/2017   CREATININE 1.06 05/17/2017   BUN 17 05/17/2017   CO2 23 05/17/2017      Assessment / Plan:   Severe three-vessel coronary artery disease with normal LV function Unstable angina Hyperlipidemia, hypertension   Patient will be scheduled for multivessel CABG by Dr. Sheliah Plane  On May 7 AM.  I discussed the plans for surgery, benefits of surgery and the expected  recovery.  Patient understands and agrees to proceed with multivessel coronary bypass surgery next week.    @ME1 @ 05/20/2017 4:22 PM

## 2017-05-20 NOTE — Progress Notes (Signed)
ANTICOAGULATION CONSULT NOTE - Initial Consult  Pharmacy Consult for heparin drip Indication: ACS/STEMI  Allergies  Allergen Reactions  . Lipitor [Atorvastatin Calcium]   . Norvasc [Amlodipine Besylate]   . Prinivil [Lisinopril]     Patient Measurements: Height:  (177.8 cm) Weight: 208 lb (94.3 kg) IBW/kg (Calculated) : 73 Heparin Dosing Weight: 94kg  Vital Signs: Temp: 98.2 F (36.8 C) (05/03 0813) Temp Source: Oral (05/03 0813) BP: 149/78 (05/03 1315) Pulse Rate: 73 (05/03 1320)  Labs: Recent Labs    05/17/17 1638  HGB 14.3  HCT 43.9  PLT 301  CREATININE 1.06    Estimated Creatinine Clearance: 80.1 mL/min (by C-G formula based on SCr of 1.06 mg/dL).   Medical History: Past Medical History:  Diagnosis Date  . Allergic rhinitis   . Benign essential hypertension   . Chest tightness    EXERTIONAL  . DJD (degenerative joint disease)   . GERD (gastroesophageal reflux disease)   . Hyperlipidemia   . Hypertension   . Metatarsal fracture 2012   LEFT FOOT  . Obstructive sleep apnea on CPAP   . Onychomycosis   . Trigger thumb of right hand   . Ventral hernia   . Ventral hernia      Assessment: 67 yo male with CP s/p cath. Pharmacy has been asked to dose heparin drip 8 hours after sheath removal (1130). Patient does not appear to have been on anticoagulation prior to procedure. Hgb 14.3, PLT 301  Goal of Therapy:  Heparin level 0.3-0.7 Monitor platelets by anticoagulation protocol: Yes   Plan:  Start heparin drip at 1200 units/hr with no bolus at 1930 Heparin level 6 hours after start Daily Heparin level, CBC Monitor for s/sx of bleeding and CBC.   Johnavon Mcclafferty A. Jeanella Craze, PharmD, BCPS Clinical Pharmacist [place] Pager: [phone]  05/20/2017,2:45 PM

## 2017-05-20 NOTE — Research (Signed)
CADFEM Informed Consent   Subject Name: Tyrone Sherman  Subject met inclusion and exclusion criteria.  The informed consent form, study requirements and expectations were reviewed with the subject and questions and concerns were addressed prior to the signing of the consent form.  The subject verbalized understanding of the trail requirements.  The subject agreed to participate in the CADFEM trial and signed the informed consent.  The informed consent was obtained prior to performance of any protocol-specific procedures for the subject.  A copy of the signed informed consent was given to the subject and a copy was placed in the subject's medical record.  [name] 05/20/2017, 8:50 AM

## 2017-05-20 NOTE — Progress Notes (Signed)
Patient refused bed alarm. Will continue to monitor patient. 

## 2017-05-20 NOTE — Interval H&P Note (Signed)
Cath Lab Visit (complete for each Cath Lab visit)  Clinical Evaluation Leading to the Procedure:   ACS: No.  Non-ACS:    Anginal Classification: CCS III  Anti-ischemic medical therapy: Minimal Therapy (1 class of medications)  Non-Invasive Test Results: High-risk stress test findings: cardiac mortality >3%/year  Prior CABG: No previous CABG      History and Physical Interval Note:  05/20/2017 11:02 AM  Tyrone Sherman  has presented today for surgery, with the diagnosis of abnormal stress  The various methods of treatment have been discussed with the patient and family. After consideration of risks, benefits and other options for treatment, the patient has consented to  Procedure(s): LEFT HEART CATH AND CORONARY ANGIOGRAPHY (N/A) as a surgical intervention .  The patient's history has been reviewed, patient examined, no change in status, stable for surgery.  I have reviewed the patient's chart and labs.  Questions were answered to the patient's satisfaction.     Lance Muss

## 2017-05-21 ENCOUNTER — Inpatient Hospital Stay (HOSPITAL_COMMUNITY): Payer: BLUE CROSS/BLUE SHIELD

## 2017-05-21 DIAGNOSIS — I361 Nonrheumatic tricuspid (valve) insufficiency: Secondary | ICD-10-CM

## 2017-05-21 DIAGNOSIS — E785 Hyperlipidemia, unspecified: Secondary | ICD-10-CM

## 2017-05-21 DIAGNOSIS — I200 Unstable angina: Secondary | ICD-10-CM

## 2017-05-21 DIAGNOSIS — I10 Essential (primary) hypertension: Secondary | ICD-10-CM

## 2017-05-21 DIAGNOSIS — I25110 Atherosclerotic heart disease of native coronary artery with unstable angina pectoris: Principal | ICD-10-CM

## 2017-05-21 DIAGNOSIS — Z01810 Encounter for preprocedural cardiovascular examination: Secondary | ICD-10-CM

## 2017-05-21 LAB — HEPARIN LEVEL (UNFRACTIONATED)
HEPARIN UNFRACTIONATED: 0.51 [IU]/mL (ref 0.30–0.70)
Heparin Unfractionated: 0.14 IU/mL — ABNORMAL LOW (ref 0.30–0.70)
Heparin Unfractionated: 0.25 IU/mL — ABNORMAL LOW (ref 0.30–0.70)

## 2017-05-21 LAB — COMPREHENSIVE METABOLIC PANEL
ALT: 30 U/L (ref 17–63)
AST: 21 U/L (ref 15–41)
Albumin: 4.1 g/dL (ref 3.5–5.0)
Alkaline Phosphatase: 44 U/L (ref 38–126)
Anion gap: 10 (ref 5–15)
BUN: 9 mg/dL (ref 6–20)
CO2: 26 mmol/L (ref 22–32)
Calcium: 9.3 mg/dL (ref 8.9–10.3)
Chloride: 99 mmol/L — ABNORMAL LOW (ref 101–111)
Creatinine, Ser: 0.73 mg/dL (ref 0.61–1.24)
GFR calc Af Amer: >60 mL/min (ref >60)
GFR calc non Af Amer: >60 mL/min (ref >60)
Glucose, Bld: 99 mg/dL (ref 65–99)
Potassium: 4.5 mmol/L (ref 3.5–5.1)
Sodium: 135 mmol/L (ref 135–145)
Total Bilirubin: 0.9 mg/dL (ref 0.3–1.2)
Total Protein: 6.9 g/dL (ref 6.5–8.1)

## 2017-05-21 LAB — PROTIME-INR
INR: 1.01
Prothrombin Time: 13.2 seconds (ref 11.4–15.2)

## 2017-05-21 LAB — LIPID PANEL
CHOL/HDL RATIO: 4.6 ratio
Cholesterol: 192 mg/dL (ref 0–200)
HDL: 42 mg/dL (ref >40)
LDL CALC: 133 mg/dL — AB (ref 0–99)
Triglycerides: 86 mg/dL (ref <150)
VLDL: 17 mg/dL (ref 0–40)

## 2017-05-21 LAB — ECHOCARDIOGRAM COMPLETE
Height: 70 in
Weight: 3251.2 oz

## 2017-05-21 LAB — HEMOGLOBIN A1C
Hgb A1c MFr Bld: 5.5 % (ref 4.8–5.6)
Mean Plasma Glucose: 111.15 mg/dL

## 2017-05-21 LAB — CBC
HEMATOCRIT: 46.9 % (ref 39.0–52.0)
Hemoglobin: 15.2 g/dL (ref 13.0–17.0)
MCH: 27.5 pg (ref 26.0–34.0)
MCHC: 32.4 g/dL (ref 30.0–36.0)
MCV: 84.8 fL (ref 78.0–100.0)
Platelets: 262 10*3/uL (ref 150–400)
RBC: 5.53 MIL/uL (ref 4.22–5.81)
RDW: 14.6 % (ref 11.5–15.5)
WBC: 9.6 10*3/uL (ref 4.0–10.5)

## 2017-05-21 LAB — TSH: TSH: 1.51 u[IU]/mL (ref 0.350–4.500)

## 2017-05-21 MED ORDER — METOPROLOL TARTRATE 12.5 MG HALF TABLET
12.5000 mg | ORAL_TABLET | Freq: Two times a day (BID) | ORAL | Status: DC
  Administered 2017-05-21 – 2017-05-23 (×6): 12.5 mg via ORAL
  Filled 2017-05-21 (×6): qty 1

## 2017-05-21 NOTE — Progress Notes (Signed)
  Echocardiogram 2D Echocardiogram has been performed.  Tyrone Sherman 05/21/2017, 9:06 AM

## 2017-05-21 NOTE — Progress Notes (Signed)
ANTICOAGULATION CONSULT NOTE   Pharmacy Consult for heparin drip Indication: ACS/STEMI  Allergies  Allergen Reactions  . Lipitor [Atorvastatin Calcium]   . Norvasc [Amlodipine Besylate]   . Prinivil [Lisinopril]     Patient Measurements: Height:  (177.8 cm) Weight: 203 lb 3.2 oz (92.2 kg)(scale b) IBW/kg (Calculated) : 73 Heparin Dosing Weight: 94kg  Vital Signs: Temp: 99.8 F (37.7 C) (05/04 0543) Temp Source: Oral (05/04 0543) BP: 136/78 (05/04 1230) Pulse Rate: 68 (05/04 1230)  Labs: Recent Labs    05/20/17 1508 05/21/17 0113 05/21/17 0602 05/21/17 0952  HGB 14.2  --  15.2  --   HCT 42.9  --  46.9  --   PLT 249  --  262  --   LABPROT  --   --  13.2  --   INR  --   --  1.01  --   HEPARINUNFRC  --  0.14*  --  0.25*  CREATININE  --   --  0.73  --     Estimated Creatinine Clearance: 105.1 mL/min (by C-G formula based on SCr of 0.73 mg/dL).   Medical History: Past Medical History:  Diagnosis Date  . Allergic rhinitis   . Benign essential hypertension   . Chest tightness    EXERTIONAL  . DJD (degenerative joint disease)   . GERD (gastroesophageal reflux disease)   . Hyperlipidemia   . Hypertension   . Metatarsal fracture 2012   LEFT FOOT  . Obstructive sleep apnea on CPAP   . Onychomycosis   . Trigger thumb of right hand   . Ventral hernia   . Ventral hernia      Assessment: 67 yo male with CP s/p cath with 3VCAD for CABG on 5/7. Pharmacy dosing heparin -heparin level = 0.25  Goal of Therapy:  Heparin level 0.3-0.7 Monitor platelets by anticoagulation protocol: Yes   Plan:  Increase heparin drip to 1550 units/hr Heparin level 6 hours after rate change Daily Heparin level, CBC  Harland German, PharmD Clinical Pharmacist Clinical phone from 8:30-4:00 is [phone] After 4pm, please call Main [facility] [phone]) for assistance. 05/21/2017 12:50 PM

## 2017-05-21 NOTE — Progress Notes (Addendum)
Progress Note  Patient Name: Tyrone Sherman Date of Encounter: 05/21/2017  Primary Cardiologist: Armanda Magic MD  Subjective   Denies chest pain and shortness of breath. Said he's bored. CABG planned for 5/7.  Inpatient Medications    Scheduled Meds: . aspirin EC  81 mg Oral Daily  . citalopram  40 mg Oral Daily  . hydrochlorothiazide  12.5 mg Oral Daily  . losartan  50 mg Oral Daily  . pantoprazole  40 mg Oral Daily  . pneumococcal 23 valent vaccine  0.5 mL Intramuscular Tomorrow-1000  . rosuvastatin  20 mg Oral Daily  . sodium chloride flush  3 mL Intravenous Q12H   Continuous Infusions: . sodium chloride    . heparin 1,400 Units/hr (05/21/17 0202)   PRN Meds: sodium chloride, acetaminophen, ALPRAZolam, ondansetron (ZOFRAN) IV, sodium chloride flush   Vital Signs    Vitals:   05/20/17 1456 05/20/17 1927 05/21/17 0019 05/21/17 0543  BP: 140/78 (!) 147/80 140/86 132/83  Pulse: 81 77 73 70  Resp: Temp: 98.6 F (37 C) (!) 97 F (36.1 C) 98.8 F (37.1 C) 99.8 F (37.7 C)  TempSrc: Oral  Oral Oral  SpO2: 95% 95% 95% 98%  Weight:    203 lb 3.2 oz (92.2 kg)  Height:        Intake/Output Summary (Last 24 hours) at 05/21/2017 1154 Last data filed at 05/21/2017 1018 Gross per 24 hour  Intake 1350 ml  Output 1925 ml  Net -575 ml   Filed Weights   05/20/17 0813 05/21/17 0543  Weight: 208 lb (94.3 kg) 203 lb 3.2 oz (92.2 kg)    Telemetry    NSR - Personally Reviewed  ECG    n/a - Personally Reviewed  Physical Exam   GEN: No acute distress.   Neck: No JVD Cardiac: RRR, no murmurs, rubs, or gallops.  Respiratory: Clear to auscultation bilaterally. GI: Soft, nontender, non-distended  MS: No edema; No deformity. Neuro:  Nonfocal  Psych: Normal affect   Labs    Chemistry Recent Labs  Lab 05/17/17 1638 05/21/17 0602  NA 142 135  K 4.7 4.5  CL 104 99*  CO2 23 26  GLUCOSE 98 99  BUN 17 9  CREATININE 1.06 0.73  CALCIUM 9.8 9.3  PROT   --  6.9  ALBUMIN  --  4.1  AST  --  21  ALT  --  30  ALKPHOS  --  44  BILITOT  --  0.9  GFRNONAA 73 >60  GFRAA 85 >60  ANIONGAP  --  10     Hematology Recent Labs  Lab 05/17/17 1638 05/20/17 1508 05/21/17 0602  WBC 8.6 6.0 9.6  RBC 5.22 5.09 5.53  HGB 14.3 14.2 15.2  HCT 43.9 42.9 46.9  MCV 84 84.3 84.8  MCH 27.4 27.9 27.5  MCHC 32.6 33.1 32.4  RDW 14.5 14.4 14.6  PLT 301 249 262    Cardiac EnzymesNo results for input(s): TROPONINI in the last 168 hours. No results for input(s): TROPIPOC in the last 168 hours.   BNPNo results for input(s): BNP, PROBNP in the last 168 hours.   DDimer No results for input(s): DDIMER in the last 168 hours.   Radiology    Dg Chest 2 View  Result Date: 05/21/2017 CLINICAL DATA:  Angina pectoris EXAM: CHEST - 2 VIEW COMPARISON:  None. FINDINGS: Normal heart size. Normal mediastinal contour. No pneumothorax. No pleural effusion. Lungs appear clear, with no  acute consolidative airspace disease and no pulmonary edema. IMPRESSION: No active cardiopulmonary disease. Electronically Signed   By: Delbert Phenix M.D.   On: 05/21/2017 09:09    Cardiac Studies   Cath (05/20/17):   Prox LAD lesion is 80% stenosed.  Ost 1st Diag lesion is 80% stenosed.  Mid Cx lesion is 95% stenosed.  Ost 2nd Mrg lesion is 70% stenosed.  Mid RCA lesion is 90% stenosed.  Post Atrio lesion is 90% stenosed.  The left ventricular ejection fraction is 55-65% by visual estimate.  The left ventricular systolic function is normal.  LV end diastolic pressure is normal.  There is no aortic valve stenosis.   Severe three vessel CAD.  Given accelerating symptoms and angina at rest, will admit for inpatient TCTS consult.  Start heparin 8 hours post sheath pull.   Patient Profile     67 y.o. male with unstable angina and found to have severe 3-vessel CAD awaiting CABG.  Assessment & Plan    1. Unstable angina with severe 3-vessel CAD: Symptomatically stable. On IV  heparin. CABG planned for 5/7. Continue ASA and Crestor. I will start low dose metoprolol tartrate 12.5 mg bid. Echo interpretation pending.  2. Hypertension: BP normal. Continue to monitor.  3. Hyperlipidemia: Continue Crestor 20 mg. I will obtain a lipid panel.     For questions or updates, please contact [facility] Please consult [email or web address] for contact info under Cardiology/STEMI.      Signed, Prentice Docker, MD  05/21/2017, 11:54 AM

## 2017-05-21 NOTE — Progress Notes (Signed)
ANTICOAGULATION CONSULT NOTE   Pharmacy Consult for heparin drip Indication: ACS/STEMI  Allergies  Allergen Reactions  . Lipitor [Atorvastatin Calcium]   . Norvasc [Amlodipine Besylate]   . Prinivil [Lisinopril]     Patient Measurements: Height:  (177.8 cm) Weight: 208 lb (94.3 kg) IBW/kg (Calculated) : 73 Heparin Dosing Weight: 94kg  Vital Signs: Temp: 98.8 F (37.1 C) (05/04 0019) Temp Source: Oral (05/04 0019) BP: 140/86 (05/04 0019) Pulse Rate: 73 (05/04 0019)  Labs: Recent Labs    05/20/17 1508 05/21/17 0113  HGB 14.2  --   HCT 42.9  --   PLT 249  --   HEPARINUNFRC  --  0.14*    Estimated Creatinine Clearance: 80.1 mL/min (by C-G formula based on SCr of 1.06 mg/dL).   Medical History: Past Medical History:  Diagnosis Date  . Allergic rhinitis   . Benign essential hypertension   . Chest tightness    EXERTIONAL  . DJD (degenerative joint disease)   . GERD (gastroesophageal reflux disease)   . Hyperlipidemia   . Hypertension   . Metatarsal fracture 2012   LEFT FOOT  . Obstructive sleep apnea on CPAP   . Onychomycosis   . Trigger thumb of right hand   . Ventral hernia   . Ventral hernia      Assessment: 67 yo male with CP s/p cath. Pharmacy has been asked to dose heparin drip 8 hours after sheath removal (1130). Patient does not appear to have been on anticoagulation prior to procedure. Hgb 14.3, PLT 301 Initial heparin level 0.14 units/hr  Goal of Therapy:  Heparin level 0.3-0.7 Monitor platelets by anticoagulation protocol: Yes   Plan:  Increase heparin drip to 1400 units/hr Heparin level 8 hours after rate change Daily Heparin level, CBC Monitor for s/sx of bleeding and CBC.   Thanks for allowing pharmacy to be a part of this patient's care.  Talbert Cage, PharmD Clinical Pharmacist 05/21/2017,1:54 AM

## 2017-05-21 NOTE — Plan of Care (Signed)
  Problem: Clinical Measurements: Goal: Ability to maintain clinical measurements within normal limits will improve Outcome: Completed/Met   Problem: Activity: Goal: Risk for activity intolerance will decrease Outcome: Completed/Met   Problem: Nutrition: Goal: Adequate nutrition will be maintained Outcome: Completed/Met

## 2017-05-21 NOTE — Progress Notes (Signed)
Pt has home meds with him. Took meds to pharmacy for them to dispense but because the hospital pharmacy supplies the meds that the patient is currenetly on, They will not agree to dispense daily the pts own pill supply.

## 2017-05-21 NOTE — Progress Notes (Signed)
Pre-op Cardiac Surgery  Carotid Findings:  1-39% ICA plaquing. Vertebral artery flow is antegrade.   Upper Extremity Right Left  Brachial Pressures 132T 155T  Radial Waveforms T T  Ulnar Waveforms T T  Palmar Arch (Allen's Test) WNL WNL        Lower  Extremity Right Left  Dorsalis Pedis    Anterior Tibial T T  Posterior Tibial T T  Ankle/Brachial Indices

## 2017-05-21 NOTE — Progress Notes (Signed)
ANTICOAGULATION CONSULT NOTE   Pharmacy Consult for heparin drip Indication: ACS/STEMI  Allergies  Allergen Reactions  . Lipitor [Atorvastatin Calcium]   . Norvasc [Amlodipine Besylate]   . Prinivil [Lisinopril]     Patient Measurements: Height:  (177.8 cm) Weight: 203 lb 3.2 oz (92.2 kg)(scale b) IBW/kg (Calculated) : 73 Heparin Dosing Weight: 94kg  Vital Signs: BP: 136/78 (05/04 1230) Pulse Rate: 68 (05/04 1230)  Labs: Recent Labs    05/20/17 1508 05/21/17 0113 05/21/17 0602 05/21/17 0952 05/21/17 1904  HGB 14.2  --  15.2  --   --   HCT 42.9  --  46.9  --   --   PLT 249  --  262  --   --   LABPROT  --   --  13.2  --   --   INR  --   --  1.01  --   --   HEPARINUNFRC  --  0.14*  --  0.25* 0.51  CREATININE  --   --  0.73  --   --     Estimated Creatinine Clearance: 105.1 mL/min (by C-G formula based on SCr of 0.73 mg/dL).   Medical History: Past Medical History:  Diagnosis Date  . Allergic rhinitis   . Benign essential hypertension   . Chest tightness    EXERTIONAL  . DJD (degenerative joint disease)   . GERD (gastroesophageal reflux disease)   . Hyperlipidemia   . Hypertension   . Metatarsal fracture 2012   LEFT FOOT  . Obstructive sleep apnea on CPAP   . Onychomycosis   . Trigger thumb of right hand   . Ventral hernia   . Ventral hernia      Assessment: 67 yo male with CP s/p cath with 3VCAD for CABG on 5/7. Pharmacy dosing heparin -heparin level = 0.51 after increase to 1550 units/hr  Goal of Therapy:  Heparin level 0.3-0.7 Monitor platelets by anticoagulation protocol: Yes   Plan:  No heparin changes Daily Heparin level, CBC  Harland German, PharmD Clinical Pharmacist Clinical phone from 8:30-4:00 is [phone] After 4pm, please call [facility] [phone]) for assistance. 05/21/2017 7:51 PM

## 2017-05-22 LAB — CBC
HEMATOCRIT: 45.9 % (ref 39.0–52.0)
Hemoglobin: 14.6 g/dL (ref 13.0–17.0)
MCH: 27 pg (ref 26.0–34.0)
MCHC: 31.8 g/dL (ref 30.0–36.0)
MCV: 85 fL (ref 78.0–100.0)
Platelets: 254 10*3/uL (ref 150–400)
RBC: 5.4 MIL/uL (ref 4.22–5.81)
RDW: 14.7 % (ref 11.5–15.5)
WBC: 8.6 10*3/uL (ref 4.0–10.5)

## 2017-05-22 LAB — HEPARIN LEVEL (UNFRACTIONATED): Heparin Unfractionated: 0.6 IU/mL (ref 0.30–0.70)

## 2017-05-22 MED ORDER — ROSUVASTATIN CALCIUM 10 MG PO TABS
40.0000 mg | ORAL_TABLET | Freq: Every day | ORAL | Status: DC
  Administered 2017-05-23 – 2017-05-28 (×5): 40 mg via ORAL
  Filled 2017-05-22 (×2): qty 1
  Filled 2017-05-22: qty 4
  Filled 2017-05-22: qty 1
  Filled 2017-05-22: qty 4

## 2017-05-22 NOTE — Progress Notes (Signed)
ANTICOAGULATION CONSULT NOTE   Pharmacy Consult for heparin drip Indication: ACS/STEMI  Allergies  Allergen Reactions  . Lipitor [Atorvastatin Calcium]   . Norvasc [Amlodipine Besylate]   . Prinivil [Lisinopril]     Patient Measurements: Height:  (177.8 cm) Weight: 204 lb 9.6 oz (92.8 kg) IBW/kg (Calculated) : 73 Heparin Dosing Weight: 94kg  Vital Signs: Temp: 97.8 F (36.6 C) (05/05 0456) Temp Source: Oral (05/05 0456) BP: 152/87 (05/05 0844) Pulse Rate: 71 (05/05 0844)  Labs: Recent Labs    05/20/17 1508  05/21/17 0602 05/21/17 0952 05/21/17 1904 05/22/17 0446  HGB 14.2  --  15.2  --   --  14.6  HCT 42.9  --  46.9  --   --  45.9  PLT 249  --  262  --   --  254  LABPROT  --   --  13.2  --   --   --   INR  --   --  1.01  --   --   --   HEPARINUNFRC  --    < >  --  0.25* 0.51 0.60  CREATININE  --   --  0.73  --   --   --    < > = values in this interval not displayed.    Estimated Creatinine Clearance: 105.3 mL/min (by C-G formula based on SCr of 0.73 mg/dL).   Medical History: Past Medical History:  Diagnosis Date  . Allergic rhinitis   . Benign essential hypertension   . Chest tightness    EXERTIONAL  . DJD (degenerative joint disease)   . GERD (gastroesophageal reflux disease)   . Hyperlipidemia   . Hypertension   . Metatarsal fracture 2012   LEFT FOOT  . Obstructive sleep apnea on CPAP   . Onychomycosis   . Trigger thumb of right hand   . Ventral hernia   . Ventral hernia      Assessment: 67 yo male with CP s/p cath with 3VCAD for CABG on 5/7. Pharmacy dosing heparin -heparin level = 0.6 on 1550 units/hr  Goal of Therapy:  Heparin level 0.3-0.7 Monitor platelets by anticoagulation protocol: Yes   Plan:  No heparin changes Daily Heparin level, CBC  Harland German, PharmD Clinical Pharmacist Clinical phone from 8:30-4:00 is [phone] After 4pm, please call [facility] [phone]) for assistance. 05/22/2017 12:57 PM

## 2017-05-22 NOTE — Progress Notes (Signed)
Progress Note  Patient Name: Tyrone Sherman Date of Encounter: 05/22/2017  Primary Cardiologist: Armanda Magic MD    Subjective   Denies chest pain, palpitations, and shortness of breath. In good spirits. CABG planned for 5/7. Son present.  Inpatient Medications    Scheduled Meds: . aspirin EC  81 mg Oral Daily  . citalopram  40 mg Oral Daily  . hydrochlorothiazide  12.5 mg Oral Daily  . losartan  50 mg Oral Daily  . metoprolol tartrate  12.5 mg Oral BID  . pantoprazole  40 mg Oral Daily  . pneumococcal 23 valent vaccine  0.5 mL Intramuscular Tomorrow-1000  . [START ON 05/23/2017] rosuvastatin  40 mg Oral Daily  . sodium chloride flush  3 mL Intravenous Q12H   Continuous Infusions: . sodium chloride    . heparin 1,550 Units/hr (05/22/17 0719)   PRN Meds: sodium chloride, acetaminophen, ALPRAZolam, ondansetron (ZOFRAN) IV, sodium chloride flush   Vital Signs    Vitals:   05/21/17 2015 05/22/17 0456 05/22/17 0500 05/22/17 0844  BP: 123/75 (!) 144/97  (!) 152/87  Pulse: 66 67  71  Resp: Temp: 98.5 F (36.9 C) 97.8 F (36.6 C)    TempSrc: Oral Oral    SpO2: 95% 98%    Weight:   204 lb 9.6 oz (92.8 kg)   Height:        Intake/Output Summary (Last 24 hours) at 05/22/2017 1003 Last data filed at 05/22/2017 0953 Gross per 24 hour  Intake 1630.36 ml  Output 1705 ml  Net -74.64 ml   Filed Weights   05/20/17 0813 05/21/17 0543 05/22/17 0500  Weight: 208 lb (94.3 kg) 203 lb 3.2 oz (92.2 kg) 204 lb 9.6 oz (92.8 kg)    Telemetry    NSR - Personally Reviewed  ECG    n/a - Personally Reviewed  Physical Exam   GEN: No acute distress.   Neck: No JVD Cardiac: RRR, no murmurs, rubs, or gallops.  Respiratory: Clear to auscultation bilaterally. GI: Soft, nontender, non-distended  MS: No edema; No deformity. Neuro:  Nonfocal  Psych: Normal affect   Labs    Chemistry Recent Labs  Lab 05/17/17 1638 05/21/17 0602  NA 142 135  K 4.7 4.5  CL 104 99*    CO2 23 26  GLUCOSE 98 99  BUN 17 9  CREATININE 1.06 0.73  CALCIUM 9.8 9.3  PROT  --  6.9  ALBUMIN  --  4.1  AST  --  21  ALT  --  30  ALKPHOS  --  44  BILITOT  --  0.9  GFRNONAA 73 >60  GFRAA 85 >60  ANIONGAP  --  10     Hematology Recent Labs  Lab 05/20/17 1508 05/21/17 0602 05/22/17 0446  WBC 6.0 9.6 8.6  RBC 5.09 5.53 5.40  HGB 14.2 15.2 14.6  HCT 42.9 46.9 45.9  MCV 84.3 84.8 85.0  MCH 27.9 27.5 27.0  MCHC 33.1 32.4 31.8  RDW 14.4 14.6 14.7  PLT 249 262 254    Cardiac EnzymesNo results for input(s): TROPONINI in the last 168 hours. No results for input(s): TROPIPOC in the last 168 hours.   BNPNo results for input(s): BNP, PROBNP in the last 168 hours.   DDimer No results for input(s): DDIMER in the last 168 hours.   Radiology    Dg Chest 2 View  Result Date: 05/21/2017 CLINICAL DATA:  Angina pectoris EXAM: CHEST - 2 VIEW  COMPARISON:  None. FINDINGS: Normal heart size. Normal mediastinal contour. No pneumothorax. No pleural effusion. Lungs appear clear, with no acute consolidative airspace disease and no pulmonary edema. IMPRESSION: No active cardiopulmonary disease. Electronically Signed   By: Delbert Phenix M.D.   On: 05/21/2017 09:09    Cardiac Studies   Cath (05/20/17):   Prox LAD lesion is 80% stenosed.  Ost 1st Diag lesion is 80% stenosed.  Mid Cx lesion is 95% stenosed.  Ost 2nd Mrg lesion is 70% stenosed.  Mid RCA lesion is 90% stenosed.  Post Atrio lesion is 90% stenosed.  The left ventricular ejection fraction is 55-65% by visual estimate.  The left ventricular systolic function is normal.  LV end diastolic pressure is normal.  There is no aortic valve stenosis.   Echo (05/21/17):  - Left ventricle: The cavity size was normal. Wall thickness was   increased in a pattern of mild LVH. Systolic function was normal.   The estimated ejection fraction was in the range of 60% to 65%.   Wall motion was normal; there were no regional wall  motion   abnormalities. Doppler parameters are consistent with abnormal   left ventricular relaxation (grade 1 diastolic dysfunction).   Indeterminate filling pressures. - Mitral valve: There was mild regurgitation. - Right ventricle: The cavity size was mildly dilated. Wall   thickness was normal. - Atrial septum: No defect or patent foramen ovale was identified.   Patient Profile     67 y.o. male with unstable angina and found to have severe 3-vessel CAD awaiting CABG.  Assessment & Plan    1. Unstable angina with severe 3-vessel CAD: Symptomatically stable. On IV heparin. CABG planned for 5/7. Continue ASA and Crestor (will increase to 40 mg as LDL 133). I started low dose metoprolol tartrate 12.5 mg bid on 5/4. Echo reviewed above with normal LV systolic function, EF 60-65%.  2. Hypertension: BP elevated today but was taken just after he had been walking. Continue to monitor.  3. Hyperlipidemia: LDL 133 on 5/4. I will increase Crestor to 40 mg. Will need repeating in 2 months.    For questions or updates, please contact [facility] Please consult [email or web address] for contact info under Cardiology/STEMI.      Signed, Prentice Docker, MD  05/22/2017, 10:03 AM

## 2017-05-22 NOTE — Plan of Care (Signed)
  Problem: Coping: Goal: Level of anxiety will decrease Outcome: Completed/Met   Problem: Elimination: Goal: Will not experience complications related to bowel motility Outcome: Completed/Met   Problem: Pain Managment: Goal: General experience of comfort will improve Outcome: Completed/Met   Problem: Safety: Goal: Ability to remain free from injury will improve Outcome: Completed/Met   Problem: Skin Integrity: Goal: Risk for impaired skin integrity will decrease Outcome: Completed/Met   

## 2017-05-23 ENCOUNTER — Inpatient Hospital Stay (HOSPITAL_COMMUNITY): Payer: BLUE CROSS/BLUE SHIELD

## 2017-05-23 ENCOUNTER — Encounter (HOSPITAL_COMMUNITY): Payer: Self-pay | Admitting: Surgery

## 2017-05-23 DIAGNOSIS — I209 Angina pectoris, unspecified: Secondary | ICD-10-CM

## 2017-05-23 DIAGNOSIS — I2510 Atherosclerotic heart disease of native coronary artery without angina pectoris: Secondary | ICD-10-CM

## 2017-05-23 DIAGNOSIS — I10 Essential (primary) hypertension: Secondary | ICD-10-CM

## 2017-05-23 DIAGNOSIS — I2583 Coronary atherosclerosis due to lipid rich plaque: Secondary | ICD-10-CM

## 2017-05-23 DIAGNOSIS — E7800 Pure hypercholesterolemia, unspecified: Secondary | ICD-10-CM

## 2017-05-23 LAB — PULMONARY FUNCTION TEST
DL/VA % pred: 98 %
DL/VA: 4.55 ml/min/mmHg/L
DLCO cor % pred: 86 %
DLCO cor: 28.06 ml/min/mmHg
DLCO unc % pred: 89 %
DLCO unc: 28.89 ml/min/mmHg
FEF 25-75 Post: 4.17 L/sec
FEF 25-75 Pre: 3.6 L/sec
FEF2575-%Change-Post: 15 %
FEF2575-%Pred-Post: 153 %
FEF2575-%Pred-Pre: 132 %
FEV1-%Change-Post: 2 %
FEV1-%Pred-Post: 106 %
FEV1-%Pred-Pre: 103 %
FEV1-Post: 3.64 L
FEV1-Pre: 3.53 L
FEV1FVC-%Change-Post: 4 %
FEV1FVC-%Pred-Pre: 107 %
FEV6-%Change-Post: -1 %
FEV6-%Pred-Post: 99 %
FEV6-%Pred-Pre: 100 %
FEV6-Post: 4.32 L
FEV6-Pre: 4.38 L
FEV6FVC-%Change-Post: 0 %
FEV6FVC-%Pred-Post: 105 %
FEV6FVC-%Pred-Pre: 105 %
FVC-%Change-Post: -1 %
FVC-%Pred-Post: 94 %
FVC-%Pred-Pre: 95 %
FVC-Post: 4.32 L
FVC-Pre: 4.39 L
Post FEV1/FVC ratio: 84 %
Post FEV6/FVC ratio: 100 %
Pre FEV1/FVC ratio: 81 %
Pre FEV6/FVC Ratio: 100 %
RV % pred: 108 %
RV: 2.54 L
TLC % pred: 102 %
TLC: 7.18 L

## 2017-05-23 LAB — CBC
HCT: 48.6 % (ref 39.0–52.0)
HEMOGLOBIN: 15.7 g/dL (ref 13.0–17.0)
MCH: 27.4 pg (ref 26.0–34.0)
MCHC: 32.3 g/dL (ref 30.0–36.0)
MCV: 84.7 fL (ref 78.0–100.0)
PLATELETS: 251 10*3/uL (ref 150–400)
RBC: 5.74 MIL/uL (ref 4.22–5.81)
RDW: 14.7 % (ref 11.5–15.5)
WBC: 8.6 10*3/uL (ref 4.0–10.5)

## 2017-05-23 LAB — HEMOGLOBIN A1C
Hgb A1c MFr Bld: 5.6 % (ref 4.8–5.6)
Mean Plasma Glucose: 114.02 mg/dL

## 2017-05-23 LAB — HEPARIN LEVEL (UNFRACTIONATED)
HEPARIN UNFRACTIONATED: 0.78 [IU]/mL — AB (ref 0.30–0.70)
HEPARIN UNFRACTIONATED: 0.39 [IU]/mL (ref 0.30–0.70)
Heparin Unfractionated: 0.45 IU/mL (ref 0.30–0.70)

## 2017-05-23 LAB — ABO/RH: ABO/RH(D): AB POS

## 2017-05-23 LAB — TYPE AND SCREEN
ABO/RH(D): AB POS
Antibody Screen: NEGATIVE

## 2017-05-23 MED ORDER — DOPAMINE-DEXTROSE 3.2-5 MG/ML-% IV SOLN
0.0000 ug/kg/min | INTRAVENOUS | Status: DC
  Filled 2017-05-23: qty 250

## 2017-05-23 MED ORDER — SODIUM CHLORIDE 0.9 % IV SOLN
1.5000 g | INTRAVENOUS | Status: DC
  Administered 2017-05-24: 1.5 g via INTRAVENOUS
  Filled 2017-05-23: qty 1.5

## 2017-05-23 MED ORDER — SODIUM CHLORIDE 0.9 % IV SOLN
INTRAVENOUS | Status: DC
  Filled 2017-05-23: qty 30

## 2017-05-23 MED ORDER — ALBUTEROL SULFATE (2.5 MG/3ML) 0.083% IN NEBU
2.5000 mg | INHALATION_SOLUTION | Freq: Once | RESPIRATORY_TRACT | Status: AC
  Administered 2017-05-23: 2.5 mg via RESPIRATORY_TRACT

## 2017-05-23 MED ORDER — CHLORHEXIDINE GLUCONATE CLOTH 2 % EX PADS
6.0000 | MEDICATED_PAD | Freq: Once | CUTANEOUS | Status: AC
  Administered 2017-05-24: 6 via TOPICAL

## 2017-05-23 MED ORDER — POTASSIUM CHLORIDE 2 MEQ/ML IV SOLN
80.0000 meq | INTRAVENOUS | Status: DC
  Filled 2017-05-23: qty 40

## 2017-05-23 MED ORDER — MILRINONE LACTATE IN DEXTROSE 20-5 MG/100ML-% IV SOLN
0.1250 ug/kg/min | INTRAVENOUS | Status: DC
  Filled 2017-05-23: qty 100

## 2017-05-23 MED ORDER — TRANEXAMIC ACID 1000 MG/10ML IV SOLN
1.5000 mg/kg/h | INTRAVENOUS | Status: DC
  Administered 2017-05-24: 1.5 mg/kg/h via INTRAVENOUS
  Filled 2017-05-23: qty 25

## 2017-05-23 MED ORDER — VANCOMYCIN HCL 10 G IV SOLR
1500.0000 mg | INTRAVENOUS | Status: DC
  Administered 2017-05-24: 1500 mg via INTRAVENOUS
  Filled 2017-05-23: qty 1500

## 2017-05-23 MED ORDER — CHLORHEXIDINE GLUCONATE CLOTH 2 % EX PADS
6.0000 | MEDICATED_PAD | Freq: Once | CUTANEOUS | Status: AC
  Administered 2017-05-23: 6 via TOPICAL

## 2017-05-23 MED ORDER — MAGNESIUM SULFATE 50 % IJ SOLN
40.0000 meq | INTRAMUSCULAR | Status: DC
  Filled 2017-05-23: qty 9.85

## 2017-05-23 MED ORDER — TRANEXAMIC ACID (OHS) BOLUS VIA INFUSION
15.0000 mg/kg | INTRAVENOUS | Status: DC
  Administered 2017-05-24: 1384.5 mg via INTRAVENOUS
  Filled 2017-05-23: qty 1385

## 2017-05-23 MED ORDER — NITROGLYCERIN IN D5W 200-5 MCG/ML-% IV SOLN
2.0000 ug/min | INTRAVENOUS | Status: DC
  Administered 2017-05-24: 10 ug/min via INTRAVENOUS
  Filled 2017-05-23: qty 250

## 2017-05-23 MED ORDER — PLASMA-LYTE 148 IV SOLN
INTRAVENOUS | Status: DC
  Filled 2017-05-23: qty 2.5

## 2017-05-23 MED ORDER — CHLORHEXIDINE GLUCONATE 0.12 % MT SOLN
15.0000 mL | Freq: Once | OROMUCOSAL | Status: AC
  Administered 2017-05-24: 15 mL via OROMUCOSAL
  Filled 2017-05-23: qty 15

## 2017-05-23 MED ORDER — METOPROLOL TARTRATE 12.5 MG HALF TABLET
12.5000 mg | ORAL_TABLET | Freq: Once | ORAL | Status: AC
  Administered 2017-05-24: 12.5 mg via ORAL
  Filled 2017-05-23: qty 1

## 2017-05-23 MED ORDER — INSULIN REGULAR HUMAN 100 UNIT/ML IJ SOLN
INTRAMUSCULAR | Status: DC
  Administered 2017-05-24: .8 [IU]/h via INTRAVENOUS
  Filled 2017-05-23: qty 1

## 2017-05-23 MED ORDER — SODIUM CHLORIDE 0.9 % IV SOLN
30.0000 ug/min | INTRAVENOUS | Status: DC
  Filled 2017-05-23: qty 2

## 2017-05-23 MED ORDER — DEXMEDETOMIDINE HCL IN NACL 400 MCG/100ML IV SOLN
0.1000 ug/kg/h | INTRAVENOUS | Status: DC
  Administered 2017-05-24: .4 ug/kg/h via INTRAVENOUS
  Filled 2017-05-23: qty 100

## 2017-05-23 MED ORDER — TRANEXAMIC ACID (OHS) PUMP PRIME SOLUTION
2.0000 mg/kg | INTRAVENOUS | Status: DC
  Filled 2017-05-23: qty 1.85

## 2017-05-23 MED ORDER — TEMAZEPAM 15 MG PO CAPS: 15.0000 mg | ORAL_CAPSULE | Freq: Once | ORAL | Status: DC | PRN

## 2017-05-23 MED ORDER — DEXTROSE 5 % IV SOLN
0.0000 ug/min | INTRAVENOUS | Status: DC
  Filled 2017-05-23: qty 4

## 2017-05-23 MED ORDER — SODIUM CHLORIDE 0.9 % IV SOLN
750.0000 mg | INTRAVENOUS | Status: DC
  Filled 2017-05-23: qty 750

## 2017-05-23 MED ORDER — BISACODYL 5 MG PO TBEC
5.0000 mg | DELAYED_RELEASE_TABLET | Freq: Once | ORAL | Status: DC
  Filled 2017-05-23: qty 1

## 2017-05-23 NOTE — Progress Notes (Signed)
Discussed sternal precautions, IS (1750 mL), mobility, and d/c planning. Good reception. Will have his wife with him at d/c. Gave resources and video to watch. He can walk independently today, has been walking in room.  [id number] Ethelda Chick CES, ACSM 9:39 AM 05/23/2017

## 2017-05-23 NOTE — Progress Notes (Signed)
ANTICOAGULATION CONSULT NOTE   Pharmacy Consult for Heparin drip Indication: ACS/STEMI  Allergies  Allergen Reactions  . Lipitor [Atorvastatin Calcium]   . Norvasc [Amlodipine Besylate]   . Prinivil [Lisinopril]     Patient Measurements: Height:  (177.8 cm) Weight: 204 lb 9.6 oz (92.8 kg) IBW/kg (Calculated) : 73 Heparin Dosing Weight: 94kg  Vital Signs: Temp: 97.8 F (36.6 C) (05/06 0549) Temp Source: Oral (05/06 0549) BP: 135/82 (05/06 0549) Pulse Rate: 68 (05/06 0549)  Labs: Recent Labs    05/21/17 0602  05/21/17 1904 05/22/17 0446 05/23/17 0555  HGB 15.2  --   --  14.6 15.7  HCT 46.9  --   --  45.9 48.6  PLT 262  --   --  254 251  LABPROT 13.2  --   --   --   --   INR 1.01  --   --   --   --   HEPARINUNFRC  --    < > 0.51 0.60 0.78*  CREATININE 0.73  --   --   --   --    < > = values in this interval not displayed.    Estimated Creatinine Clearance: 105.3 mL/min (by C-G formula based on SCr of 0.73 mg/dL).   Medical History: Past Medical History:  Diagnosis Date  . Allergic rhinitis   . Benign essential hypertension   . Chest tightness    EXERTIONAL  . DJD (degenerative joint disease)   . GERD (gastroesophageal reflux disease)   . Hyperlipidemia   . Hypertension   . Metatarsal fracture 2012   LEFT FOOT  . Obstructive sleep apnea on CPAP   . Onychomycosis   . Trigger thumb of right hand   . Ventral hernia   . Ventral hernia     Assessment: 67 yo male with CP s/p cath with 3VCAD for CABG on 5/7. Pharmacy dosing heparin -heparin level is elevated this AM  Goal of Therapy:  Heparin level 0.3-0.7 Monitor platelets by anticoagulation protocol: Yes   Plan:  Dec heparin to 1400 units/hr 1500 HL  Abran Duke, PharmD, BCPS Clinical Pharmacist Phone: [phone]

## 2017-05-23 NOTE — Progress Notes (Signed)
[address]       [place] [id number]             [phone]                 3 Days Post-Op Procedure(s) (LRB): LEFT HEART CATH AND CORONARY ANGIOGRAPHY (N/A)  LOS: 3 days   Subjective: Patient seen by Dr. Maren Beach several days ago for consultation for coronary artery bypass grafting.  He presented with exertional symptoms over the past several months.  Including chest discomfort while on a mission trip to [place].  At the time of his yearly physical he mentioned the symptoms to Dr. Kevan Ny who will arrange for stress testing.  Cardiac catheterization revealed significant three-vessel disease.  Patient has no previous history of myocardial infarction.  Since admission he has had no recurrent chest pain.  Objective: Vital signs in last 24 hours: Patient Vitals for the past 24 hrs:  BP Temp Temp src Pulse SpO2 Weight  05/23/17 0549 135/82 97.8 F (36.6 C) Oral 68 99 % 203 lb 8 oz (92.3 kg)  05/22/17 2124 (!) 153/81 97.6 F (36.4 C) Oral 71 98 % -    Filed Weights   05/21/17 0543 05/22/17 0500 05/23/17 0549  Weight: 203 lb 3.2 oz (92.2 kg) 204 lb 9.6 oz (92.8 kg) 203 lb 8 oz (92.3 kg)    Hemodynamic parameters for last 24 hours:    Intake/Output from previous day: 05/05 0701 - 05/06 0700 In: 1270 [P.O.:720; I.V.:550] Out: 2205 [Urine:2205] Intake/Output this shift: Total I/O In: 324.5 [P.O.:224; I.V.:100.5] Out: 750 [Urine:750]  Scheduled Meds: . aspirin EC  81 mg Oral Daily  . citalopram  40 mg Oral Daily  . [START ON 05/24/2017] heparin-papaverine-plasmalyte irrigation   Irrigation To OR  . hydrochlorothiazide  12.5 mg Oral Daily  . losartan  50 mg Oral Daily  . [START ON 05/24/2017] magnesium sulfate  40 mEq Other To OR  . metoprolol tartrate  12.5 mg Oral BID  . pantoprazole  40 mg Oral Daily  . pneumococcal 23 valent vaccine  0.5 mL Intramuscular Tomorrow-1000  . [START ON 05/24/2017] potassium chloride  80 mEq Other To OR  . rosuvastatin  40 mg  Oral Daily  . sodium chloride flush  3 mL Intravenous Q12H  . [START ON 05/24/2017] tranexamic acid  15 mg/kg Intravenous To OR  . [START ON 05/24/2017] tranexamic acid  2 mg/kg Intracatheter To OR   Continuous Infusions: . sodium chloride    . [START ON 05/24/2017] cefUROXime (ZINACEF)  IV    . [START ON 05/24/2017] cefUROXime (ZINACEF)  IV    . [START ON 05/24/2017] dexmedetomidine    . [START ON 05/24/2017] DOPamine    . [START ON 05/24/2017] epinephrine    . [START ON 05/24/2017] heparin 30,000 units/NS 1000 mL solution for CELLSAVER    . heparin 1,400 Units/hr (05/23/17 0840)  . [START ON 05/24/2017] insulin (NOVOLIN-R) infusion    . [START ON 05/24/2017] milrinone    . [START ON 05/24/2017] nitroGLYCERIN    . [START ON 05/24/2017] phenylephrine /210mL NS (0.08mg /ml) infusion    . [START ON 05/24/2017] tranexamic acid (CYKLOKAPRON) infusion ([facility])    . [START ON 05/24/2017] vancomycin     PRN Meds:.sodium chloride, acetaminophen, ALPRAZolam, ondansetron (ZOFRAN) IV, sodium chloride flush  General appearance: alert and cooperative Neurologic: intact Heart: regular rate and rhythm, S1, S2 normal, no murmur, click, rub or gallop Lungs: clear to auscultation bilaterally Abdomen: soft,  non-tender; bowel sounds normal; no masses,  no organomegaly and Umbilical hernia present Extremities: extremities normal, atraumatic, no cyanosis or edema and Homans sign is negative, no sign of DVT Patient does have superficial varicosities in the skin around the right knee much less around the left, and both ankles appears to have suitable vein for bypass  Lab Results: CBC: Recent Labs    05/22/17 0446 05/23/17 0555  WBC 8.6 8.6  HGB 14.6 15.7  HCT 45.9 48.6  PLT 254 251   BMET:  Recent Labs    05/21/17 0602  NA 135  K 4.5  CL 99*  CO2 26  GLUCOSE 99  BUN 9  CREATININE 0.73  CALCIUM 9.3    PT/INR:  Recent Labs    05/21/17 0602  LABPROT 13.2  INR 1.01     Radiology No results  found.   Assessment/Plan: S/P Procedure(s) (LRB): LEFT HEART CATH AND CORONARY ANGIOGRAPHY (N/A) Plan for coronary artery bypass grafting tomorrow.  I have seen the patient reviewed his history, reviewed recommendations made by Dr. Maren Beach and agree that the most reasonable approach is to proceed with coronary artery bypass grafting.  The patient has significant three-vessel coronary artery disease with symptoms.  I discussed with him the treatment options, he is agreeable with proceeding with planned coronary artery bypass grafting.   The goals risks and alternatives of the planned surgical procedure Procedure(s): LEFT HEART CATH AND CORONARY ANGIOGRAPHY (N/A)  have been discussed with the patient in detail. The risks of the procedure including death, infection, stroke, myocardial infarction, bleeding, blood transfusion have all been discussed specifically.  I have quoted Tyrone Sherman a 2 % of perioperative mortality and a complication rate as high as 40 %. The patient's questions have been answered.Tyrone Sherman is willing  to proceed with the planned procedure.  Delight Ovens MD 05/23/2017 2:57 PM     Patient ID: Tyrone Sherman, male   DOB: 1950-02-25, 67 y.o.   MRN: [id number]

## 2017-05-23 NOTE — Anesthesia Preprocedure Evaluation (Addendum)
Anesthesia Evaluation  Patient identified by MRN, date of birth, ID band Patient awake    Reviewed: Allergy & Precautions, NPO status , Patient's Chart, lab work & pertinent test results  Airway Mallampati: II  TM Distance: >3 FB Neck ROM: Full    Dental  (+) Teeth Intact, Dental Advisory Given   Pulmonary sleep apnea and Continuous Positive Airway Pressure Ventilation ,    breath sounds clear to auscultation       Cardiovascular hypertension, Pt. on medications + CAD   Rhythm:Regular Rate:Normal     Neuro/Psych negative neurological ROS  negative psych ROS   GI/Hepatic Neg liver ROS, GERD  Medicated,  Endo/Other  negative endocrine ROS  Renal/GU negative Renal ROS  negative genitourinary   Musculoskeletal  (+) Arthritis ,   Abdominal Normal abdominal exam  (+)   Peds  Hematology negative hematology ROS (+)   Anesthesia Other Findings   Reproductive/Obstetrics                          Lab Results  Component Value Date   WBC 8.6 05/23/2017   HGB 15.7 05/23/2017   HCT 48.6 05/23/2017   MCV 84.7 05/23/2017   PLT 251 05/23/2017   Lab Results  Component Value Date   CREATININE 0.73 05/21/2017   BUN 9 05/21/2017   NA 135 05/21/2017   K 4.5 05/21/2017   CL 99 (L) 05/21/2017   CO2 26 05/21/2017   Lab Results  Component Value Date   INR 1.01 05/21/2017   Echo: - Left ventricle: The cavity size was normal. Wall thickness was increased in a pattern of mild LVH. Systolic function was normal. The estimated ejection fraction was in the range of 60% to 65%. Wall motion was normal; there were no regional wall motion abnormalities. Doppler parameters are consistent with abnormal left ventricular relaxation (grade 1 diastolic dysfunction).   Indeterminate filling pressures. - Mitral valve: There was mild regurgitation. - Right ventricle: The cavity size was mildly dilated. Wall thickness was  normal. - Atrial septum: No defect or patent foramen ovale was identified.  Cardiac Cath:  Prox LAD lesion is 80% stenosed.  Ost 1st Diag lesion is 80% stenosed.  Mid Cx lesion is 95% stenosed.  Ost 2nd Mrg lesion is 70% stenosed.  Mid RCA lesion is 90% stenosed.  Post Atrio lesion is 90% stenosed.  The left ventricular ejection fraction is 55-65% by visual estimate.  The left ventricular systolic function is normal.  LV end diastolic pressure is normal.  There is no aortic valve stenosis.  Anesthesia Physical Anesthesia Plan  ASA: IV  Anesthesia Plan: General   Post-op Pain Management:    Induction: Intravenous  PONV Risk Score and Plan: Treatment may vary due to age or medical condition  Airway Management Planned: Oral ETT  Additional Equipment: Arterial line, CVP, PA Cath, TEE and Ultrasound Guidance Line Placement  Intra-op Plan:   Post-operative Plan: Post-operative intubation/ventilation  Informed Consent:   Plan Discussed with: CRNA  Anesthesia Plan Comments: (Vasopressin syringe and diluted NTG syringe for post bypass. )        Anesthesia Quick Evaluation

## 2017-05-23 NOTE — Progress Notes (Signed)
ANTICOAGULATION CONSULT NOTE - Follow Up  Pharmacy Consult for Heparin drip Indication: ACS/STEMI  Patient Measurements: Height:  (177.8 cm) Weight: 203 lb 8 oz (92.3 kg) IBW/kg (Calculated) : 73 Heparin Dosing Weight: 94kg  Vital Signs: Temp: 97.8 F (36.6 C) (05/06 0549) Temp Source: Oral (05/06 0549) BP: 135/82 (05/06 0549) Pulse Rate: 68 (05/06 0549)  Labs: Recent Labs    05/21/17 0602  05/22/17 0446 05/23/17 0555 05/23/17 1522  HGB 15.2  --  14.6 15.7  --   HCT 46.9  --  45.9 48.6  --   PLT 262  --  254 251  --   LABPROT 13.2  --   --   --   --   INR 1.01  --   --   --   --   HEPARINUNFRC  --    < > 0.60 0.78* 0.39  CREATININE 0.73  --   --   --   --    < > = values in this interval not displayed.    Estimated Creatinine Clearance: 105.1 mL/min (by C-G formula based on SCr of 0.73 mg/dL).   Assessment: 67 yo male with CP s/p cath with 3VCAD for CABG on 5/7. Pharmacy consulted to dose heparin for anticoagulation.   The patient's heparin level this afternoon resulted as therapeutic after a rate decrease earlier today (HL 0.39 << 0.78, goal of 0.3-0.7)  Goal of Therapy:  Heparin level 0.3-0.7 Monitor platelets by anticoagulation protocol: Yes   Plan:  - Continue Heparin at 1400 units/hr (14 ml/hr) - Will continue to monitor for any signs/symptoms of bleeding and will follow up with heparin level in 6 hours   Thank you for allowing pharmacy to be a part of this patient's care.  Georgina Pillion, PharmD, BCPS Clinical Pharmacist Pager: [phone] Clinical phone for 05/23/2017: [phone] 05/23/2017 4:41 PM

## 2017-05-23 NOTE — Progress Notes (Signed)
ANTICOAGULATION CONSULT NOTE - Follow Up  Pharmacy Consult for Heparin drip Indication: ACS/STEMI  Patient Measurements: Height:  (177.8 cm) Weight: 203 lb 8 oz (92.3 kg) IBW/kg (Calculated) : 73 Heparin Dosing Weight: 94kg  Vital Signs:    Labs: Recent Labs    05/21/17 0602  05/22/17 0446 05/23/17 0555 05/23/17 1522 05/23/17 2121  HGB 15.2  --  14.6 15.7  --   --   HCT 46.9  --  45.9 48.6  --   --   PLT 262  --  254 251  --   --   LABPROT 13.2  --   --   --   --   --   INR 1.01  --   --   --   --   --   HEPARINUNFRC  --    < > 0.60 0.78* 0.39 0.45  CREATININE 0.73  --   --   --   --   --    < > = values in this interval not displayed.    Estimated Creatinine Clearance: 105.1 mL/min (by C-G formula based on SCr of 0.73 mg/dL).   Assessment: 67 yo male with CP s/p cath with 3VCAD for CABG on 5/7. Pharmacy consulted to dose heparin for anticoagulation.   The patient's heparin level this evening resulted as therapeutic at 0.45   Goal of Therapy:  Heparin level 0.3-0.7 Monitor platelets by anticoagulation protocol: Yes   Plan:  - Continue Heparin at 1400 units/hr (14 ml/hr) - Will continue to monitor for any signs/symptoms of bleeding  -HL in AM   Thank you for allowing pharmacy to be a part of this patient's care.  Jonathen Rathman A. Jeanella Craze, PharmD, BCPS Clinical Pharmacist [place] Pager: [phone]  05/23/2017 10:43 PM

## 2017-05-23 NOTE — Progress Notes (Signed)
Progress Note  Patient Name: Tyrone Sherman Date of Encounter: 05/23/2017  Primary Cardiologist: Armanda Magic, MD   Subjective   Pt sitting in chair, pleasant and in good spirits. No chest pain or shortness or breath.   Inpatient Medications    Scheduled Meds: . aspirin EC  81 mg Oral Daily  . citalopram  40 mg Oral Daily  . [START ON 05/24/2017] heparin-papaverine-plasmalyte irrigation   Irrigation To OR  . hydrochlorothiazide  12.5 mg Oral Daily  . losartan  50 mg Oral Daily  . [START ON 05/24/2017] magnesium sulfate  40 mEq Other To OR  . metoprolol tartrate  12.5 mg Oral BID  . pantoprazole  40 mg Oral Daily  . pneumococcal 23 valent vaccine  0.5 mL Intramuscular Tomorrow-1000  . [START ON 05/24/2017] potassium chloride  80 mEq Other To OR  . rosuvastatin  40 mg Oral Daily  . sodium chloride flush  3 mL Intravenous Q12H  . [START ON 05/24/2017] tranexamic acid  15 mg/kg Intravenous To OR  . [START ON 05/24/2017] tranexamic acid  2 mg/kg Intracatheter To OR   Continuous Infusions: . sodium chloride    . [START ON 05/24/2017] cefUROXime (ZINACEF)  IV    . [START ON 05/24/2017] cefUROXime (ZINACEF)  IV    . [START ON 05/24/2017] dexmedetomidine    . [START ON 05/24/2017] DOPamine    . [START ON 05/24/2017] epinephrine    . [START ON 05/24/2017] heparin 30,000 units/NS 1000 mL solution for CELLSAVER    . heparin 1,400 Units/hr (05/23/17 0840)  . [START ON 05/24/2017] insulin (NOVOLIN-R) infusion    . [START ON 05/24/2017] milrinone    . [START ON 05/24/2017] nitroGLYCERIN    . [START ON 05/24/2017] phenylephrine /271mL NS (0.08mg /ml) infusion    . [START ON 05/24/2017] tranexamic acid (CYKLOKAPRON) infusion ([facility])    . [START ON 05/24/2017] vancomycin     PRN Meds: sodium chloride, acetaminophen, ALPRAZolam, ondansetron (ZOFRAN) IV, sodium chloride flush   Vital Signs    Vitals:   05/22/17 0500 05/22/17 0844 05/22/17 2124 05/23/17 0549  BP:  (!) 152/87 (!) 153/81 135/82  Pulse:  71 71 68    Resp:  18    Temp:   97.6 F (36.4 C) 97.8 F (36.6 C)  TempSrc:   Oral Oral  SpO2:   98% 99%  Weight: 204 lb 9.6 oz (92.8 kg)   203 lb 8 oz (92.3 kg)  Height:        Intake/Output Summary (Last 24 hours) at 05/23/2017 0906 Last data filed at 05/23/2017 0700 Gross per 24 hour  Intake 1071.59 ml  Output 1725 ml  Net -653.41 ml   Filed Weights   05/21/17 0543 05/22/17 0500 05/23/17 0549  Weight: 203 lb 3.2 oz (92.2 kg) 204 lb 9.6 oz (92.8 kg) 203 lb 8 oz (92.3 kg)    Telemetry    NSR 60's-70's - Personally Reviewed  ECG    No new tracings - Personally Reviewed  Physical Exam   GEN: No acute distress.   Neck: No JVD Cardiac: RRR, no murmurs, rubs, or gallops.  Respiratory: Clear to auscultation bilaterally. GI: Soft, nontender, non-distended  MS: No edema; No deformity. Neuro:  Nonfocal  Psych: Normal affect   Labs    Chemistry Recent Labs  Lab 05/17/17 1638 05/21/17 0602  NA 142 135  K 4.7 4.5  CL 104 99*  CO2 23 26  GLUCOSE 98 99  BUN 17 9  CREATININE 1.06  0.73  CALCIUM 9.8 9.3  PROT  --  6.9  ALBUMIN  --  4.1  AST  --  21  ALT  --  30  ALKPHOS  --  44  BILITOT  --  0.9  GFRNONAA 73 >60  GFRAA 85 >60  ANIONGAP  --  10     Hematology Recent Labs  Lab 05/21/17 0602 05/22/17 0446 05/23/17 0555  WBC 9.6 8.6 8.6  RBC 5.53 5.40 5.74  HGB 15.2 14.6 15.7  HCT 46.9 45.9 48.6  MCV 84.8 85.0 84.7  MCH 27.5 27.0 27.4  MCHC 32.4 31.8 32.3  RDW 14.6 14.7 14.7  PLT 262 254 251    Cardiac EnzymesNo results for input(s): TROPONINI in the last 168 hours. No results for input(s): TROPIPOC in the last 168 hours.   BNPNo results for input(s): BNP, PROBNP in the last 168 hours.   DDimer No results for input(s): DDIMER in the last 168 hours.   Radiology    No results found.  Cardiac Studies    Cath (05/20/17):   Prox LAD lesion is 80% stenosed.  Ost 1st Diag lesion is 80% stenosed.  Mid Cx lesion is 95% stenosed.  Ost 2nd Mrg lesion is  70% stenosed.  Mid RCA lesion is 90% stenosed.  Post Atrio lesion is 90% stenosed.  The left ventricular ejection fraction is 55-65% by visual estimate.  The left ventricular systolic function is normal.  LV end diastolic pressure is normal.  There is no aortic valve stenosis.   Echo (05/21/17):  - Left ventricle: The cavity size was normal. Wall thickness was increased in a pattern of mild LVH. Systolic function was normal. The estimated ejection fraction was in the range of 60% to 65%. Wall motion was normal; there were no regional wall motion abnormalities. Doppler parameters are consistent with abnormal left ventricular relaxation (grade 1 diastolic dysfunction). Indeterminate filling pressures. - Mitral valve: There was mild regurgitation. - Right ventricle: The cavity size was mildly dilated. Wall thickness was normal. - Atrial septum: No defect or patent foramen ovale was identified.    Patient Profile     67 y.o. male with history of OSA on CPAP, HTN, HLD, GERD, and DJD admitted with unstable angina dn found to have 3-vessel CAD, awaiting CABG.   Assessment & Plan    1. Unstable Angina -CVD risk factors include HTN, HLD, strong family hx. Non-smoker, no daibetes.  -3-vessel CAD found on cath 05/20/17. Normal LV systolic function on echo 05/21/17. CABG planned for 05/24/17 -Treating with IV heparin, aspirin, statin, low dose BB started 5/4. -Currently in good spirits and chest pain free.   2. Hypertension -Continuing on his home meds losartan 50 mg and HCTZ 12.5 mg with addition of metoprolol 12.5 mg bid.  -BP elevated yesterday, improved today 135/82  3. Hyperlipidemia -LDL 133 on 05/21/17. High intensity statin, Crestor increased to 40 mg from 20 mg at home.   4. OSA -Uses CPAP consistently  For questions or updates, please contact [facility] Please consult [email or web address] for contact info under Cardiology/STEMI.      Signed, Berton Bon, NP  05/23/2017, 9:06 AM

## 2017-05-23 NOTE — Plan of Care (Signed)
  Problem: Education: Goal: Knowledge of General Education information will improve Outcome: Progressing Note:  POC reviewed with pt.   

## 2017-05-24 ENCOUNTER — Inpatient Hospital Stay (HOSPITAL_COMMUNITY): Payer: BLUE CROSS/BLUE SHIELD

## 2017-05-24 ENCOUNTER — Inpatient Hospital Stay (HOSPITAL_COMMUNITY): Payer: BLUE CROSS/BLUE SHIELD | Admitting: Certified Registered Nurse Anesthetist

## 2017-05-24 ENCOUNTER — Encounter (HOSPITAL_COMMUNITY)
Admission: AD | Disposition: A | Discharge status: Home / Self Care | Payer: Self-pay | Source: Ambulatory Visit | Attending: Cardiology

## 2017-05-24 DIAGNOSIS — Z951 Presence of aortocoronary bypass graft: Secondary | ICD-10-CM

## 2017-05-24 HISTORY — PX: TEE WITHOUT CARDIOVERSION: SHX5443

## 2017-05-24 HISTORY — PX: CORONARY ARTERY BYPASS GRAFT: SHX141

## 2017-05-24 LAB — GLUCOSE, CAPILLARY
Glucose-Capillary: 108 mg/dL — ABNORMAL HIGH (ref 65–99)
Glucose-Capillary: 101 mg/dL — ABNORMAL HIGH (ref 65–99)
Glucose-Capillary: 128 mg/dL — ABNORMAL HIGH (ref 65–99)
Glucose-Capillary: 122 mg/dL — ABNORMAL HIGH (ref 65–99)
Glucose-Capillary: 115 mg/dL — ABNORMAL HIGH (ref 65–99)
Glucose-Capillary: 135 mg/dL — ABNORMAL HIGH (ref 65–99)
Glucose-Capillary: 126 mg/dL — ABNORMAL HIGH (ref 65–99)

## 2017-05-24 LAB — POCT I-STAT, CHEM 8
BUN: 15 mg/dL (ref 6–20)
BUN: 17 mg/dL (ref 6–20)
BUN: 13 mg/dL (ref 6–20)
BUN: 14 mg/dL (ref 6–20)
BUN: 14 mg/dL (ref 6–20)
BUN: 15 mg/dL (ref 6–20)
BUN: 10 mg/dL (ref 6–20)
CALCIUM ION: 1.01 mmol/L — AB (ref 1.15–1.40)
CALCIUM ION: 1.09 mmol/L — AB (ref 1.15–1.40)
CHLORIDE: 102 mmol/L (ref 101–111)
CREATININE: 0.5 mg/dL — AB (ref 0.61–1.24)
CREATININE: 0.6 mg/dL — AB (ref 0.61–1.24)
Calcium, Ion: 1.26 mmol/L (ref 1.15–1.40)
Calcium, Ion: 1.16 mmol/L (ref 1.15–1.40)
Calcium, Ion: 1.08 mmol/L — ABNORMAL LOW (ref 1.15–1.40)
Calcium, Ion: 1.09 mmol/L — ABNORMAL LOW (ref 1.15–1.40)
Calcium, Ion: 1.1 mmol/L — ABNORMAL LOW (ref 1.15–1.40)
Chloride: 101 mmol/L (ref 101–111)
Chloride: 102 mmol/L (ref 101–111)
Chloride: 99 mmol/L — ABNORMAL LOW (ref 101–111)
Chloride: 101 mmol/L (ref 101–111)
Chloride: 101 mmol/L (ref 101–111)
Chloride: 103 mmol/L (ref 101–111)
Creatinine, Ser: 0.6 mg/dL — ABNORMAL LOW (ref 0.61–1.24)
Creatinine, Ser: 0.7 mg/dL (ref 0.61–1.24)
Creatinine, Ser: 0.5 mg/dL — ABNORMAL LOW (ref 0.61–1.24)
Creatinine, Ser: 0.6 mg/dL — ABNORMAL LOW (ref 0.61–1.24)
Creatinine, Ser: 0.5 mg/dL — ABNORMAL LOW (ref 0.61–1.24)
GLUCOSE: 92 mg/dL (ref 65–99)
GLUCOSE: 138 mg/dL — AB (ref 65–99)
Glucose, Bld: 101 mg/dL — ABNORMAL HIGH (ref 65–99)
Glucose, Bld: 99 mg/dL (ref 65–99)
Glucose, Bld: 190 mg/dL — ABNORMAL HIGH (ref 65–99)
Glucose, Bld: 148 mg/dL — ABNORMAL HIGH (ref 65–99)
Glucose, Bld: 125 mg/dL — ABNORMAL HIGH (ref 65–99)
HCT: 42 % (ref 39.0–52.0)
HCT: 38 % — ABNORMAL LOW (ref 39.0–52.0)
HCT: 33 % — ABNORMAL LOW (ref 39.0–52.0)
HCT: 33 % — ABNORMAL LOW (ref 39.0–52.0)
HCT: 33 % — ABNORMAL LOW (ref 39.0–52.0)
HCT: 36 % — ABNORMAL LOW (ref 39.0–52.0)
HCT: 35 % — ABNORMAL LOW (ref 39.0–52.0)
HEMOGLOBIN: 11.2 g/dL — AB (ref 13.0–17.0)
Hemoglobin: 14.3 g/dL (ref 13.0–17.0)
Hemoglobin: 12.9 g/dL — ABNORMAL LOW (ref 13.0–17.0)
Hemoglobin: 11.2 g/dL — ABNORMAL LOW (ref 13.0–17.0)
Hemoglobin: 11.2 g/dL — ABNORMAL LOW (ref 13.0–17.0)
Hemoglobin: 12.2 g/dL — ABNORMAL LOW (ref 13.0–17.0)
Hemoglobin: 11.9 g/dL — ABNORMAL LOW (ref 13.0–17.0)
POTASSIUM: 4.7 mmol/L (ref 3.5–5.1)
Potassium: 4.3 mmol/L (ref 3.5–5.1)
Potassium: 4.9 mmol/L (ref 3.5–5.1)
Potassium: 4.7 mmol/L (ref 3.5–5.1)
Potassium: 4.5 mmol/L (ref 3.5–5.1)
Potassium: 4.4 mmol/L (ref 3.5–5.1)
Potassium: 4.3 mmol/L (ref 3.5–5.1)
Sodium: 139 mmol/L (ref 135–145)
Sodium: 137 mmol/L (ref 135–145)
Sodium: 141 mmol/L (ref 135–145)
Sodium: 136 mmol/L (ref 135–145)
Sodium: 135 mmol/L (ref 135–145)
Sodium: 138 mmol/L (ref 135–145)
Sodium: 139 mmol/L (ref 135–145)
TCO2: 29 mmol/L (ref 22–32)
TCO2: 29 mmol/L (ref 22–32)
TCO2: 25 mmol/L (ref 22–32)
TCO2: 25 mmol/L (ref 22–32)
TCO2: 24 mmol/L (ref 22–32)
TCO2: 26 mmol/L (ref 22–32)
TCO2: 25 mmol/L (ref 22–32)

## 2017-05-24 LAB — CBC
HCT: 37.2 % — ABNORMAL LOW (ref 39.0–52.0)
HEMATOCRIT: 40.4 % (ref 39.0–52.0)
Hemoglobin: 13.6 g/dL (ref 13.0–17.0)
Hemoglobin: 12.3 g/dL — ABNORMAL LOW (ref 13.0–17.0)
MCH: 28.3 pg (ref 26.0–34.0)
MCH: 27.6 pg (ref 26.0–34.0)
MCHC: 33.7 g/dL (ref 30.0–36.0)
MCHC: 33.1 g/dL (ref 30.0–36.0)
MCV: 84.2 fL (ref 78.0–100.0)
MCV: 83.6 fL (ref 78.0–100.0)
PLATELETS: 193 10*3/uL (ref 150–400)
Platelets: 206 10*3/uL (ref 150–400)
RBC: 4.8 MIL/uL (ref 4.22–5.81)
RBC: 4.45 MIL/uL (ref 4.22–5.81)
RDW: 14.3 % (ref 11.5–15.5)
RDW: 14.2 % (ref 11.5–15.5)
WBC: 20.8 10*3/uL — ABNORMAL HIGH (ref 4.0–10.5)
WBC: 15.6 10*3/uL — ABNORMAL HIGH (ref 4.0–10.5)

## 2017-05-24 LAB — POCT I-STAT 4, (NA,K, GLUC, HGB,HCT)
Glucose, Bld: 117 mg/dL — ABNORMAL HIGH (ref 65–99)
HCT: 38 % — ABNORMAL LOW (ref 39.0–52.0)
Hemoglobin: 12.9 g/dL — ABNORMAL LOW (ref 13.0–17.0)
Potassium: 4.1 mmol/L (ref 3.5–5.1)
Sodium: 141 mmol/L (ref 135–145)

## 2017-05-24 LAB — POCT I-STAT 3, ART BLOOD GAS (G3+)
Acid-Base Excess: 1 mmol/L (ref 0.0–2.0)
Acid-Base Excess: 1 mmol/L (ref 0.0–2.0)
Acid-base deficit: 2 mmol/L (ref 0.0–2.0)
Bicarbonate: 26.8 mmol/L (ref 20.0–28.0)
Bicarbonate: 26.5 mmol/L (ref 20.0–28.0)
Bicarbonate: 26 mmol/L (ref 20.0–28.0)
Bicarbonate: 24.6 mmol/L (ref 20.0–28.0)
O2 Saturation: 100 %
O2 Saturation: 97 %
O2 Saturation: 98 %
O2 Saturation: 98 %
Patient temperature: 36.1
Patient temperature: 36.4
Patient temperature: 36.8
TCO2: 28 mmol/L (ref 22–32)
TCO2: 28 mmol/L (ref 22–32)
TCO2: 27 mmol/L (ref 22–32)
TCO2: 26 mmol/L (ref 22–32)
pCO2 arterial: 44.9 mmHg (ref 32.0–48.0)
pCO2 arterial: 44.9 mmHg (ref 32.0–48.0)
pCO2 arterial: 43.1 mmHg (ref 32.0–48.0)
pCO2 arterial: 45.8 mmHg (ref 32.0–48.0)
pH, Arterial: 7.383 (ref 7.350–7.450)
pH, Arterial: 7.374 (ref 7.350–7.450)
pH, Arterial: 7.386 (ref 7.350–7.450)
pH, Arterial: 7.337 — ABNORMAL LOW (ref 7.350–7.450)
pO2, Arterial: 313 mmHg — ABNORMAL HIGH (ref 83.0–108.0)
pO2, Arterial: 88 mmHg (ref 83.0–108.0)
pO2, Arterial: 98 mmHg (ref 83.0–108.0)
pO2, Arterial: 111 mmHg — ABNORMAL HIGH (ref 83.0–108.0)

## 2017-05-24 LAB — MAGNESIUM: Magnesium: 2.8 mg/dL — ABNORMAL HIGH (ref 1.7–2.4)

## 2017-05-24 LAB — APTT: aPTT: 28 seconds (ref 24–36)

## 2017-05-24 LAB — PLATELET COUNT: Platelets: 274 10*3/uL (ref 150–400)

## 2017-05-24 LAB — CREATININE, SERUM
Creatinine, Ser: 0.62 mg/dL (ref 0.61–1.24)
GFR calc Af Amer: >60 mL/min (ref >60)
GFR calc non Af Amer: >60 mL/min (ref >60)

## 2017-05-24 LAB — PROTIME-INR
INR: 1.31
Prothrombin Time: 16.1 seconds — ABNORMAL HIGH (ref 11.4–15.2)

## 2017-05-24 LAB — HEMOGLOBIN AND HEMATOCRIT, BLOOD
HCT: 35.5 % — ABNORMAL LOW (ref 39.0–52.0)
Hemoglobin: 11.9 g/dL — ABNORMAL LOW (ref 13.0–17.0)

## 2017-05-24 SURGERY — CORONARY ARTERY BYPASS GRAFTING (CABG)
Anesthesia: General | Site: Chest

## 2017-05-24 MED ORDER — LACTATED RINGERS IV SOLN
INTRAVENOUS | Status: DC | PRN
  Administered 2017-05-24 (×2): via INTRAVENOUS

## 2017-05-24 MED ORDER — MORPHINE SULFATE (PF) 2 MG/ML IV SOLN: 1.0000 mg | INTRAVENOUS | Status: AC | PRN

## 2017-05-24 MED ORDER — FAMOTIDINE IN NACL 20-0.9 MG/50ML-% IV SOLN
20.0000 mg | Freq: Two times a day (BID) | INTRAVENOUS | Status: AC
  Administered 2017-05-24 (×2): 20 mg via INTRAVENOUS
  Filled 2017-05-24: qty 50

## 2017-05-24 MED ORDER — LACTATED RINGERS IV SOLN
INTRAVENOUS | Status: DC
  Administered 2017-05-24: 23:00:00 via INTRAVENOUS

## 2017-05-24 MED ORDER — EPHEDRINE SULFATE-NACL 50-0.9 MG/10ML-% IV SOSY
PREFILLED_SYRINGE | INTRAVENOUS | Status: DC | PRN
  Administered 2017-05-24: 5 mg via INTRAVENOUS

## 2017-05-24 MED ORDER — HEPARIN SODIUM (PORCINE) 1000 UNIT/ML IJ SOLN
INTRAMUSCULAR | Status: AC
Start: 2017-05-24 — End: ?
  Filled 2017-05-24: qty 1

## 2017-05-24 MED ORDER — NITROGLYCERIN IN D5W 200-5 MCG/ML-% IV SOLN: 0.0000 ug/min | INTRAVENOUS | Status: DC

## 2017-05-24 MED ORDER — SODIUM CHLORIDE 0.9 % IV SOLN: 250.0000 mL | INTRAVENOUS | Status: DC

## 2017-05-24 MED ORDER — BISACODYL 5 MG PO TBEC
10.0000 mg | DELAYED_RELEASE_TABLET | Freq: Every day | ORAL | Status: DC
  Administered 2017-05-25: 10 mg via ORAL
  Filled 2017-05-24: qty 2

## 2017-05-24 MED ORDER — VASOPRESSIN 20 UNIT/ML IV SOLN
INTRAVENOUS | Status: DC | PRN
  Administered 2017-05-24 (×4): 1 [IU] via INTRAVENOUS

## 2017-05-24 MED ORDER — ALBUMIN HUMAN 5 % IV SOLN
250.0000 mL | INTRAVENOUS | Status: AC | PRN
Start: 2017-05-24 — End: 2017-05-25
  Administered 2017-05-24 (×3): 250 mL via INTRAVENOUS
  Filled 2017-05-24: qty 250

## 2017-05-24 MED ORDER — SODIUM CHLORIDE 0.45 % IV SOLN
INTRAVENOUS | Status: DC | PRN
Start: 2017-05-24 — End: 2017-05-26

## 2017-05-24 MED ORDER — DOPAMINE-DEXTROSE 3.2-5 MG/ML-% IV SOLN: 3.0000 ug/kg/min | INTRAVENOUS | Status: AC

## 2017-05-24 MED ORDER — CEFUROXIME SODIUM 750 MG IJ SOLR
INTRAMUSCULAR | Status: DC | PRN
  Administered 2017-05-24: 750 mg via INTRAVENOUS

## 2017-05-24 MED ORDER — HEMOSTATIC AGENTS (NO CHARGE) OPTIME
TOPICAL | Status: DC | PRN
  Administered 2017-05-24 (×3): 1 via TOPICAL

## 2017-05-24 MED ORDER — PHENYLEPHRINE 40 MCG/ML (10ML) SYRINGE FOR IV PUSH (FOR BLOOD PRESSURE SUPPORT)
PREFILLED_SYRINGE | INTRAVENOUS | Status: DC | PRN
  Administered 2017-05-24 (×2): 80 ug via INTRAVENOUS

## 2017-05-24 MED ORDER — MIDAZOLAM HCL 2 MG/2ML IJ SOLN: 2.0000 mg | INTRAMUSCULAR | Status: DC | PRN

## 2017-05-24 MED ORDER — PROTAMINE SULFATE 10 MG/ML IV SOLN
INTRAVENOUS | Status: DC | PRN
  Administered 2017-05-24: 20 mg via INTRAVENOUS
  Administered 2017-05-24 (×6): 30 mg via INTRAVENOUS

## 2017-05-24 MED ORDER — METOPROLOL TARTRATE 5 MG/5ML IV SOLN: 2.5000 mg | INTRAVENOUS | Status: DC | PRN

## 2017-05-24 MED ORDER — POTASSIUM CHLORIDE 10 MEQ/50ML IV SOLN: 10.0000 meq | INTRAVENOUS | Status: AC

## 2017-05-24 MED ORDER — SODIUM CHLORIDE 0.9 % IV SOLN
INTRAVENOUS | Status: DC
  Filled 2017-05-24: qty 1

## 2017-05-24 MED ORDER — METOCLOPRAMIDE HCL 5 MG/ML IJ SOLN
10.0000 mg | Freq: Four times a day (QID) | INTRAMUSCULAR | Status: AC
  Administered 2017-05-24 – 2017-05-25 (×3): 10 mg via INTRAVENOUS
  Filled 2017-05-24 (×2): qty 2

## 2017-05-24 MED ORDER — HEPARIN SODIUM (PORCINE) 1000 UNIT/ML IJ SOLN
INTRAMUSCULAR | Status: DC | PRN
  Administered 2017-05-24: 30000 [IU] via INTRAVENOUS

## 2017-05-24 MED ORDER — LACTATED RINGERS IV SOLN
INTRAVENOUS | Status: DC | PRN
  Administered 2017-05-24: 07:00:00 via INTRAVENOUS

## 2017-05-24 MED ORDER — ASPIRIN 81 MG PO CHEW
324.0000 mg | CHEWABLE_TABLET | Freq: Every day | ORAL | Status: DC
  Administered 2017-05-25: 324 mg
  Filled 2017-05-24: qty 4

## 2017-05-24 MED ORDER — DEXMEDETOMIDINE HCL IN NACL 200 MCG/50ML IV SOLN: 0.0000 ug/kg/h | INTRAVENOUS | Status: DC

## 2017-05-24 MED ORDER — METOPROLOL TARTRATE 25 MG/10 ML ORAL SUSPENSION: 12.5000 mg | Freq: Two times a day (BID) | ORAL | Status: DC

## 2017-05-24 MED ORDER — MAGNESIUM SULFATE 4 GM/100ML IV SOLN
4.0000 g | Freq: Once | INTRAVENOUS | Status: AC
  Administered 2017-05-24: 4 g via INTRAVENOUS
  Filled 2017-05-24: qty 100

## 2017-05-24 MED ORDER — ROCURONIUM BROMIDE 50 MG/5ML IV SOLN
INTRAVENOUS | Status: AC
  Filled 2017-05-24: qty 1

## 2017-05-24 MED ORDER — ASPIRIN EC 325 MG PO TBEC: 325.0000 mg | DELAYED_RELEASE_TABLET | Freq: Every day | ORAL | Status: DC

## 2017-05-24 MED ORDER — ONDANSETRON HCL 4 MG/2ML IJ SOLN
4.0000 mg | Freq: Four times a day (QID) | INTRAMUSCULAR | Status: DC | PRN
  Administered 2017-05-24 – 2017-05-25 (×2): 4 mg via INTRAVENOUS
  Filled 2017-05-24 (×2): qty 2

## 2017-05-24 MED ORDER — METOPROLOL TARTRATE 12.5 MG HALF TABLET
12.5000 mg | ORAL_TABLET | Freq: Two times a day (BID) | ORAL | Status: DC
  Administered 2017-05-25: 12.5 mg via ORAL
  Filled 2017-05-24: qty 1

## 2017-05-24 MED ORDER — 0.9 % SODIUM CHLORIDE (POUR BTL) OPTIME
TOPICAL | Status: DC | PRN
  Administered 2017-05-24 (×2): 1000 mL

## 2017-05-24 MED ORDER — NITROGLYCERIN 0.2 MG/ML ON CALL CATH LAB
INTRAVENOUS | Status: DC | PRN
  Administered 2017-05-24: 40 ug via INTRAVENOUS
  Administered 2017-05-24 (×3): 20 ug via INTRAVENOUS
  Administered 2017-05-24: 40 ug via INTRAVENOUS

## 2017-05-24 MED ORDER — DOPAMINE-DEXTROSE 3.2-5 MG/ML-% IV SOLN
INTRAVENOUS | Status: DC | PRN
  Administered 2017-05-24: 3 ug/kg/min via INTRAVENOUS

## 2017-05-24 MED ORDER — LACTATED RINGERS IV SOLN
INTRAVENOUS | Status: DC
  Administered 2017-05-25: 20:00:00 via INTRAVENOUS

## 2017-05-24 MED ORDER — INSULIN REGULAR BOLUS VIA INFUSION
0.0000 [IU] | Freq: Three times a day (TID) | INTRAVENOUS | Status: DC
  Filled 2017-05-24: qty 10

## 2017-05-24 MED ORDER — DOCUSATE SODIUM 100 MG PO CAPS
200.0000 mg | ORAL_CAPSULE | Freq: Every day | ORAL | Status: DC
  Administered 2017-05-25: 200 mg via ORAL
  Filled 2017-05-24: qty 2

## 2017-05-24 MED ORDER — VECURONIUM BROMIDE 10 MG IV SOLR
INTRAVENOUS | Status: AC
  Filled 2017-05-24: qty 40

## 2017-05-24 MED ORDER — PHENYLEPHRINE HCL 10 MG/ML IJ SOLN
INTRAMUSCULAR | Status: DC | PRN
  Administered 2017-05-24: 30 ug/min via INTRAVENOUS

## 2017-05-24 MED ORDER — SODIUM CHLORIDE 0.9 % IV SOLN
INTRAVENOUS | Status: DC
Start: 2017-05-24 — End: 2017-05-26

## 2017-05-24 MED ORDER — ACETAMINOPHEN 500 MG PO TABS
1000.0000 mg | ORAL_TABLET | Freq: Four times a day (QID) | ORAL | Status: DC
  Administered 2017-05-24 – 2017-05-26 (×6): 1000 mg via ORAL
  Filled 2017-05-24 (×6): qty 2

## 2017-05-24 MED ORDER — ACETAMINOPHEN 160 MG/5ML PO SOLN: 1000.0000 mg | Freq: Four times a day (QID) | ORAL | Status: DC

## 2017-05-24 MED ORDER — PROTAMINE SULFATE 10 MG/ML IV SOLN
INTRAVENOUS | Status: AC
  Filled 2017-05-24: qty 25

## 2017-05-24 MED ORDER — SODIUM CHLORIDE 0.9% FLUSH: 3.0000 mL | INTRAVENOUS | Status: DC | PRN

## 2017-05-24 MED ORDER — SODIUM CHLORIDE 0.9 % IV SOLN
INTRAVENOUS | Status: DC | PRN
  Administered 2017-05-24: 13:00:00 via INTRAVENOUS

## 2017-05-24 MED ORDER — MORPHINE SULFATE (PF) 2 MG/ML IV SOLN
2.0000 mg | INTRAVENOUS | Status: DC | PRN
  Administered 2017-05-24 – 2017-05-25 (×4): 2 mg via INTRAVENOUS
  Filled 2017-05-24 (×4): qty 1

## 2017-05-24 MED ORDER — SODIUM CHLORIDE 0.9 % IV SOLN
0.0000 ug/min | INTRAVENOUS | Status: DC
  Administered 2017-05-25: 20 ug/min via INTRAVENOUS
  Filled 2017-05-24: qty 20
  Filled 2017-05-24: qty 2

## 2017-05-24 MED ORDER — VASOPRESSIN 20 UNIT/ML IV SOLN
INTRAVENOUS | Status: AC
  Filled 2017-05-24: qty 1

## 2017-05-24 MED ORDER — ACETAMINOPHEN 650 MG RE SUPP: 650.0000 mg | Freq: Once | RECTAL | Status: AC

## 2017-05-24 MED ORDER — TRAMADOL HCL 50 MG PO TABS
50.0000 mg | ORAL_TABLET | ORAL | Status: DC | PRN
  Administered 2017-05-25: 100 mg via ORAL
  Filled 2017-05-24: qty 2

## 2017-05-24 MED ORDER — MIDAZOLAM HCL 5 MG/5ML IJ SOLN
INTRAMUSCULAR | Status: DC | PRN
  Administered 2017-05-24: 2 mg via INTRAVENOUS
  Administered 2017-05-24: 3 mg via INTRAVENOUS
  Administered 2017-05-24 (×3): 2 mg via INTRAVENOUS
  Administered 2017-05-24: 1 mg via INTRAVENOUS

## 2017-05-24 MED ORDER — PROPOFOL 10 MG/ML IV BOLUS
INTRAVENOUS | Status: AC
  Filled 2017-05-24: qty 20

## 2017-05-24 MED ORDER — ACETAMINOPHEN 160 MG/5ML PO SOLN
650.0000 mg | Freq: Once | ORAL | Status: AC
  Administered 2017-05-24: 650 mg

## 2017-05-24 MED ORDER — FENTANYL CITRATE (PF) 250 MCG/5ML IJ SOLN
INTRAMUSCULAR | Status: DC | PRN
  Administered 2017-05-24 (×4): 100 ug via INTRAVENOUS
  Administered 2017-05-24 (×2): 50 ug via INTRAVENOUS
  Administered 2017-05-24 (×2): 100 ug via INTRAVENOUS
  Administered 2017-05-24: 50 ug via INTRAVENOUS
  Administered 2017-05-24: 150 ug via INTRAVENOUS
  Administered 2017-05-24: 100 ug via INTRAVENOUS
  Administered 2017-05-24 (×2): 50 ug via INTRAVENOUS
  Administered 2017-05-24: 100 ug via INTRAVENOUS
  Administered 2017-05-24: 50 ug via INTRAVENOUS
  Administered 2017-05-24: 100 ug via INTRAVENOUS
  Administered 2017-05-24 (×3): 50 ug via INTRAVENOUS

## 2017-05-24 MED ORDER — PROTAMINE SULFATE 10 MG/ML IV SOLN
INTRAVENOUS | Status: AC
  Filled 2017-05-24: qty 5

## 2017-05-24 MED ORDER — PHENYLEPHRINE 40 MCG/ML (10ML) SYRINGE FOR IV PUSH (FOR BLOOD PRESSURE SUPPORT)
PREFILLED_SYRINGE | INTRAVENOUS | Status: AC
  Filled 2017-05-24: qty 10

## 2017-05-24 MED ORDER — ROCURONIUM BROMIDE 100 MG/10ML IV SOLN
INTRAVENOUS | Status: DC | PRN
  Administered 2017-05-24: 30 mg via INTRAVENOUS

## 2017-05-24 MED ORDER — SODIUM CHLORIDE 0.9% FLUSH
3.0000 mL | Freq: Two times a day (BID) | INTRAVENOUS | Status: DC
  Administered 2017-05-25: 3 mL via INTRAVENOUS
  Administered 2017-05-25: 10 mL via INTRAVENOUS

## 2017-05-24 MED ORDER — OXYCODONE HCL 5 MG PO TABS
5.0000 mg | ORAL_TABLET | ORAL | Status: DC | PRN
  Administered 2017-05-25: 10 mg via ORAL
  Filled 2017-05-24: qty 2

## 2017-05-24 MED ORDER — CHLORHEXIDINE GLUCONATE 0.12 % MT SOLN
15.0000 mL | OROMUCOSAL | Status: AC
  Administered 2017-05-24: 15 mL via OROMUCOSAL

## 2017-05-24 MED ORDER — BISACODYL 10 MG RE SUPP
10.0000 mg | Freq: Every day | RECTAL | Status: DC
  Filled 2017-05-24: qty 1

## 2017-05-24 MED ORDER — VANCOMYCIN HCL IN DEXTROSE 1-5 GM/200ML-% IV SOLN
1000.0000 mg | Freq: Once | INTRAVENOUS | Status: AC
  Administered 2017-05-24: 1000 mg via INTRAVENOUS
  Filled 2017-05-24: qty 200

## 2017-05-24 MED ORDER — LACTATED RINGERS IV SOLN: 500.0000 mL | Freq: Once | INTRAVENOUS | Status: DC | PRN

## 2017-05-24 MED ORDER — SODIUM CHLORIDE 0.9 % IJ SOLN
OROMUCOSAL | Status: DC | PRN
  Administered 2017-05-24 (×3): 4 mL via TOPICAL

## 2017-05-24 MED ORDER — MIDAZOLAM HCL 10 MG/2ML IJ SOLN
INTRAMUSCULAR | Status: AC
  Filled 2017-05-24: qty 2

## 2017-05-24 MED ORDER — SODIUM CHLORIDE 0.9 % IV SOLN
1.5000 g | Freq: Two times a day (BID) | INTRAVENOUS | Status: AC
  Administered 2017-05-24 – 2017-05-26 (×4): 1.5 g via INTRAVENOUS
  Filled 2017-05-24 (×4): qty 1.5

## 2017-05-24 MED ORDER — MIDAZOLAM HCL 2 MG/2ML IJ SOLN
INTRAMUSCULAR | Status: AC
  Filled 2017-05-24: qty 2

## 2017-05-24 MED ORDER — VECURONIUM BROMIDE 10 MG IV SOLR
INTRAVENOUS | Status: DC | PRN
  Administered 2017-05-24: 4 mg via INTRAVENOUS
  Administered 2017-05-24 (×3): 10 mg via INTRAVENOUS
  Administered 2017-05-24: 6 mg via INTRAVENOUS

## 2017-05-24 MED ORDER — EPHEDRINE 5 MG/ML INJ
INTRAVENOUS | Status: AC
  Filled 2017-05-24: qty 10

## 2017-05-24 MED ORDER — PLASMA-LYTE 148 IV SOLN
INTRAVENOUS | Status: DC | PRN
  Administered 2017-05-24: 500 mL via INTRAVASCULAR

## 2017-05-24 MED ORDER — FENTANYL CITRATE (PF) 250 MCG/5ML IJ SOLN
INTRAMUSCULAR | Status: AC
  Filled 2017-05-24: qty 25

## 2017-05-24 MED ORDER — PROPOFOL 10 MG/ML IV BOLUS
INTRAVENOUS | Status: DC | PRN
  Administered 2017-05-24: 120 mg via INTRAVENOUS

## 2017-05-24 MED ORDER — FENTANYL CITRATE (PF) 250 MCG/5ML IJ SOLN
INTRAMUSCULAR | Status: AC
  Filled 2017-05-24: qty 5

## 2017-05-24 MED FILL — Heparin Sod (Porcine)-NaCl IV Soln 1000 Unit/500ML-0.9%: INTRAVENOUS | Qty: 1000 | Status: AC

## 2017-05-24 SURGICAL SUPPLY — 86 items
BAG DECANTER FOR FLEXI CONT (MISCELLANEOUS) ×3 IMPLANT
BANDAGE ACE 4X5 VEL STRL LF (GAUZE/BANDAGES/DRESSINGS) ×3 IMPLANT
BANDAGE ACE 6X5 VEL STRL LF (GAUZE/BANDAGES/DRESSINGS) ×3 IMPLANT
BLADE NEEDLE 3 SS STRL (BLADE) ×3 IMPLANT
BLADE STERNUM SYSTEM 6 (BLADE) ×3 IMPLANT
BLADE SURG 11 STRL SS (BLADE) ×3 IMPLANT
BNDG GAUZE ELAST 4 BULKY (GAUZE/BANDAGES/DRESSINGS) ×3 IMPLANT
CANISTER SUCT 3000ML PPV (MISCELLANEOUS) ×3 IMPLANT
CANNULA VESSEL 3MM 2 BLNT TIP (CANNULA) ×3 IMPLANT
CATH CPB KIT GERHARDT (MISCELLANEOUS) ×3 IMPLANT
CATH THORACIC 28FR (CATHETERS) ×3 IMPLANT
CRADLE DONUT ADULT HEAD (MISCELLANEOUS) IMPLANT
DERMABOND ADVANCED (GAUZE/BANDAGES/DRESSINGS) ×1
DERMABOND ADVANCED .7 DNX12 (GAUZE/BANDAGES/DRESSINGS) ×2 IMPLANT
DRAIN CHANNEL 28F RND 3/8 FF (WOUND CARE) ×3 IMPLANT
DRAPE CARDIOVASCULAR INCISE (DRAPES) ×1
DRAPE SLUSH/WARMER DISC (DRAPES) ×3 IMPLANT
DRAPE SRG 135X102X78XABS (DRAPES) ×2 IMPLANT
DRSG AQUACEL AG ADV 3.5X14 (GAUZE/BANDAGES/DRESSINGS) ×3 IMPLANT
ELECT BLADE 4.0 EZ CLEAN MEGAD (MISCELLANEOUS) ×3
ELECT REM PT RETURN 9FT ADLT (ELECTROSURGICAL) ×6
ELECTRODE BLDE 4.0 EZ CLN MEGD (MISCELLANEOUS) ×2 IMPLANT
ELECTRODE REM PT RTRN 9FT ADLT (ELECTROSURGICAL) ×4 IMPLANT
FELT TEFLON 1X6 (MISCELLANEOUS) ×3 IMPLANT
GAUZE SPONGE 4X4 12PLY STRL (GAUZE/BANDAGES/DRESSINGS) IMPLANT
GAUZE SPONGE 4X4 12PLY STRL LF (GAUZE/BANDAGES/DRESSINGS) ×6 IMPLANT
GLOVE BIO SURGEON STRL SZ 6.5 (GLOVE) ×9 IMPLANT
GLOVE BIOGEL M 6.5 STRL (GLOVE) ×6 IMPLANT
GLOVE BIOGEL M STER SZ 6 (GLOVE) ×6 IMPLANT
GLOVE BIOGEL PI IND STRL 7.0 (GLOVE) ×8 IMPLANT
GLOVE BIOGEL PI IND STRL 8 (GLOVE) ×6 IMPLANT
GLOVE BIOGEL PI IND STRL 8.5 (GLOVE) ×4 IMPLANT
GLOVE BIOGEL PI INDICATOR 7.0 (GLOVE) ×4
GLOVE BIOGEL PI INDICATOR 8 (GLOVE) ×3
GLOVE BIOGEL PI INDICATOR 8.5 (GLOVE) ×2
GLOVE ECLIPSE 8.0 STRL XLNG CF (GLOVE) ×18 IMPLANT
GOWN STRL REUS W/ TWL LRG LVL3 (GOWN DISPOSABLE) ×12 IMPLANT
GOWN STRL REUS W/TWL 2XL LVL3 (GOWN DISPOSABLE) ×15 IMPLANT
GOWN STRL REUS W/TWL LRG LVL3 (GOWN DISPOSABLE) ×6
HEMOSTAT POWDER SURGIFOAM 1G (HEMOSTASIS) ×9 IMPLANT
HEMOSTAT SURGICEL 2X14 (HEMOSTASIS) ×3 IMPLANT
KIT BASIN OR (CUSTOM PROCEDURE TRAY) ×3 IMPLANT
KIT CATH SUCT 8FR (CATHETERS) ×3 IMPLANT
KIT SUCTION CATH 14FR (SUCTIONS) ×6 IMPLANT
KIT TURNOVER KIT B (KITS) ×3 IMPLANT
KIT VASOVIEW HEMOPRO VH 3000 (KITS) ×3 IMPLANT
LEAD PACING MYOCARDI (MISCELLANEOUS) ×3 IMPLANT
MARKER GRAFT CORONARY BYPASS (MISCELLANEOUS) ×9 IMPLANT
NS IRRIG 1000ML POUR BTL (IV SOLUTION) ×15 IMPLANT
PACK E OPEN HEART (SUTURE) ×3 IMPLANT
PACK OPEN HEART (CUSTOM PROCEDURE TRAY) ×3 IMPLANT
PAD ARMBOARD 7.5X6 YLW CONV (MISCELLANEOUS) ×6 IMPLANT
PAD ELECT DEFIB RADIOL ZOLL (MISCELLANEOUS) ×3 IMPLANT
PENCIL BUTTON HOLSTER BLD 10FT (ELECTRODE) ×3 IMPLANT
PUNCH AORTIC ROT 4.0MM RCL 40 (MISCELLANEOUS) ×3 IMPLANT
PUNCH AORTIC ROTATE  4.5MM 8IN (MISCELLANEOUS) ×3 IMPLANT
SET CARDIOPLEGIA MPS 5001102 (MISCELLANEOUS) ×3 IMPLANT
SOLUTION ANTI FOG 6CC (MISCELLANEOUS) ×3 IMPLANT
SPONGE LAP 18X18 X RAY DECT (DISPOSABLE) ×12 IMPLANT
SUT BONE WAX W31G (SUTURE) ×3 IMPLANT
SUT ETHIBOND 2 0 SH (SUTURE) ×1
SUT ETHIBOND 2 0 SH 36X2 (SUTURE) ×2 IMPLANT
SUT MNCRL AB 4-0 PS2 18 (SUTURE) ×3 IMPLANT
SUT PROLENE 3 0 SH1 36 (SUTURE) ×6 IMPLANT
SUT PROLENE 4 0 TF (SUTURE) ×6 IMPLANT
SUT PROLENE 5 0 C 1 36 (SUTURE) ×3 IMPLANT
SUT PROLENE 6 0 C 1 30 (SUTURE) ×12 IMPLANT
SUT PROLENE 6 0 CC (SUTURE) ×24 IMPLANT
SUT PROLENE 7 0 BV1 MDA (SUTURE) ×3 IMPLANT
SUT PROLENE 8 0 BV175 6 (SUTURE) ×15 IMPLANT
SUT PROLENE BLUE 7 0 (SUTURE) ×3 IMPLANT
SUT SILK 2 0 SH CR/8 (SUTURE) ×3 IMPLANT
SUT STEEL 6MS V (SUTURE) ×3 IMPLANT
SUT STEEL SZ 6 DBL 3X14 BALL (SUTURE) ×3 IMPLANT
SUT VIC AB 1 CTX 18 (SUTURE) ×6 IMPLANT
SUT VIC AB 2-0 CT1 27 (SUTURE) ×1
SUT VIC AB 2-0 CT1 TAPERPNT 27 (SUTURE) ×2 IMPLANT
SYSTEM SAHARA CHEST DRAIN ATS (WOUND CARE) ×3 IMPLANT
TAPE CLOTH SURG 4X10 WHT LF (GAUZE/BANDAGES/DRESSINGS) ×6 IMPLANT
TAPE PAPER 2X10 WHT MICROPORE (GAUZE/BANDAGES/DRESSINGS) ×3 IMPLANT
TOWEL GREEN STERILE (TOWEL DISPOSABLE) ×3 IMPLANT
TOWEL GREEN STERILE FF (TOWEL DISPOSABLE) ×3 IMPLANT
TRAY FOLEY SILVER 16FR TEMP (SET/KITS/TRAYS/PACK) ×3 IMPLANT
TUBING INSUFFLATION (TUBING) ×3 IMPLANT
UNDERPAD 30X30 (UNDERPADS AND DIAPERS) ×3 IMPLANT
WATER STERILE IRR 1000ML POUR (IV SOLUTION) ×6 IMPLANT

## 2017-05-24 NOTE — Brief Op Note (Signed)
      [address]       [place] [address]             [phone]      05/24/2017  7:40 AM  PATIENT:  Tyrone Sherman  67 y.o. male  PRE-OPERATIVE DIAGNOSIS:  CAD  POST-OPERATIVE DIAGNOSIS:  CAD  PROCEDURE:  Procedure(s):  CORONARY ARTERY BYPASS GRAFTING x 6 -LIMA to LAD -SVG to DIAGONAL -SEQ SVG to OM 1 and OM 2 -SEQ SVG to acute margin  and PDA  ENDOSCOPIC HARVEST GREATER SAPHENOUS VEIN -Right Leg  TRANSESOPHAGEAL ECHOCARDIOGRAM (TEE) (N/A)  SURGEON:  Surgeon(s) and Role:    * Delight Ovens, MD - Primary  PHYSICIAN ASSISTANT: Lowella Dandy PA-C  ANESTHESIA:   general  EBL:  900 mL   BLOOD ADMINISTERED: CELLSAVER  DRAINS: Left Pleural Chest Tubes, Mediastinal Chest Drains   LOCAL MEDICATIONS USED:  NONE  SPECIMEN:  No Specimen  DISPOSITION OF SPECIMEN:  N/A  COUNTS:  YES   DICTATION: .Dragon Dictation  PLAN OF CARE: Admit to inpatient   PATIENT DISPOSITION:  ICU - intubated and hemodynamically stable.   Delay start of Pharmacological VTE agent (>24hrs) due to surgical blood loss or risk of bleeding: yes

## 2017-05-24 NOTE — Anesthesia Procedure Notes (Signed)
Central Venous Catheter Insertion Performed by: Nolon Nations, MD, anesthesiologist Start/End5/07/2017 7:00 AM, 05/24/2017 7:15 AM Patient location: Pre-op. Preanesthetic checklist: patient identified, IV checked, site marked, risks and benefits discussed, surgical consent, monitors and equipment checked, pre-op evaluation, timeout performed and anesthesia consent Position: Trendelenburg Lidocaine 1% used for infiltration and patient sedated Hand hygiene performed  and maximum sterile barriers used  Catheter size: 9 Fr MAC introducer Procedure performed using ultrasound guided technique. Ultrasound Notes:anatomy identified, needle tip was noted to be adjacent to the nerve/plexus identified, no ultrasound evidence of intravascular and/or intraneural injection and image(s) printed for medical record Attempts: 1 Following insertion, line sutured, dressing applied and Biopatch. Post procedure assessment: blood return through all ports, free fluid flow and no air  Patient tolerated the procedure well with no immediate complications.

## 2017-05-24 NOTE — Anesthesia Procedure Notes (Signed)
Arterial Line Insertion Start/End5/07/2017 6:50 AM Performed by: Jed Limerick, CRNA, CRNA  Patient location: Pre-op. Preanesthetic checklist: patient identified, IV checked, site marked, risks and benefits discussed, surgical consent, monitors and equipment checked, pre-op evaluation, timeout performed and anesthesia consent Lidocaine 1% used for infiltration Left, radial was placed Catheter size: 20 G Hand hygiene performed  and maximum sterile barriers used   Attempts: 1 Procedure performed without using ultrasound guided technique. Following insertion, dressing applied and Biopatch. Post procedure assessment: normal and unchanged  Patient tolerated the procedure well with no immediate complications.

## 2017-05-24 NOTE — Progress Notes (Signed)
[facility] BRIEF SICU PROGRESS NOTE  Day of Surgery  S/P Procedure(s) (LRB): CORONARY ARTERY BYPASS GRAFTING (CABG) (N/A) TRANSESOPHAGEAL ECHOCARDIOGRAM (TEE) (N/A)   Extubated uneventfully NSR w/ stable hemodynamics Breathing comfortably w/ O2 sats 99%  Chest tube output low UOP excellent Labs okay  Plan: Continue routine early postop  Purcell Nails, MD 05/24/2017 6:14 PM

## 2017-05-24 NOTE — Anesthesia Procedure Notes (Signed)
Central Venous Catheter Insertion Performed by: Lewie Loron, MD, anesthesiologist Start/End5/07/2017 7:15 AM, 05/24/2017 7:20 AM Patient location: Pre-op. Preanesthetic checklist: patient identified, IV checked, site marked, risks and benefits discussed, surgical consent, monitors and equipment checked, pre-op evaluation, timeout performed and anesthesia consent Hand hygiene performed  and maximum sterile barriers used  PA cath was placed.Swan type:thermodilution PA Cath depth:50 Procedure performed without using ultrasound guided technique. Attempts: 1 Patient tolerated the procedure well with no immediate complications.

## 2017-05-24 NOTE — Anesthesia Procedure Notes (Signed)
Procedure Name: Intubation Date/Time: 05/24/2017 7:51 AM Performed by: Candis Shine, CRNA Pre-anesthesia Checklist: Patient identified, Emergency Drugs available, Suction available and Patient being monitored Patient Re-evaluated:Patient Re-evaluated prior to induction Oxygen Delivery Method: Circle System Utilized Preoxygenation: Pre-oxygenation with 100% oxygen Induction Type: IV induction Ventilation: Mask ventilation without difficulty Laryngoscope Size: Mac and 4 Grade View: Grade I Tube type: Oral Tube size: 8.0 mm Number of attempts: 1 Airway Equipment and Method: Stylet Placement Confirmation: ETT inserted through vocal cords under direct vision,  positive ETCO2 and breath sounds checked- equal and bilateral Secured at: 23 cm Tube secured with: Tape Dental Injury: Teeth and Oropharynx as per pre-operative assessment

## 2017-05-24 NOTE — Transfer of Care (Signed)
Immediate Anesthesia Transfer of Care Note  Patient: Tyrone Sherman  Procedure(s) Performed: CORONARY ARTERY BYPASS GRAFTING (CABG) (N/A Chest) TRANSESOPHAGEAL ECHOCARDIOGRAM (TEE) (N/A )  Patient Location: PACU and SICU  Anesthesia Type:General  Level of Consciousness: Patient remains intubated per anesthesia plan  Airway & Oxygen Therapy: Patient remains intubated per anesthesia plan  Post-op Assessment: Report given to RN and Post -op Vital signs reviewed and stable  Post vital signs: Reviewed and stable  Last Vitals:  Vitals Value Taken Time  BP 84/57 05/24/2017  2:20 PM  Temp 36.1 C 05/24/2017  2:29 PM  Pulse 91 05/24/2017  2:18 PM  Resp 12 05/24/2017  2:29 PM  SpO2 98 % 05/24/2017  2:18 PM  Vitals shown include unvalidated device data.  Last Pain:  Vitals:   05/24/17 0523  TempSrc: Oral  PainSc:          Complications: No apparent anesthesia complications

## 2017-05-24 NOTE — Procedures (Signed)
Extubation Procedure Note  Patient Details:   Name: Tyrone Sherman DOB: Oct 23, 1950 MRN: [id number]   Airway Documentation:    Vent end date: 05/24/17 Vent end time: 1730   Evaluation  O2 sats: stable throughout Complications: No apparent complications Patient did tolerate procedure well. Bilateral Breath Sounds: Clear   Yes   Patient was extubated to a 4L [place] without any complications, dyspnea or stridor noted. Patient was instructed on IS x 5, highest goal achieved was 1,083mL. NIF: -30, VC: 1.4L.   Leimomi Zervas, Margaretmary Dys 05/24/2017, 5:30 PM

## 2017-05-24 NOTE — Progress Notes (Signed)
      [address]       [place] [address]             [phone]     Katy Fitch has been scheduled for Procedure(s): CORONARY ARTERY BYPASS GRAFTING (CABG) (N/A) TRANSESOPHAGEAL ECHOCARDIOGRAM (TEE) (N/A) today. The various methods of treatment have been discussed with the patient. After consideration of the risks, benefits and treatment options the patient has consented to the planned procedure.   The patient has been seen and labs reviewed. There are no changes in the patient's condition to prevent proceeding with the planned procedure today.  Recent labs:  Lab Results  Component Value Date   WBC 8.6 05/23/2017   HGB 15.7 05/23/2017   HCT 48.6 05/23/2017   PLT 251 05/23/2017   GLUCOSE 99 05/21/2017   CHOL 192 05/21/2017   TRIG 86 05/21/2017   HDL 42 05/21/2017   LDLCALC 133 (H) 05/21/2017   ALT 30 05/21/2017   AST 21 05/21/2017   NA 135 05/21/2017   K 4.5 05/21/2017   CL 99 (L) 05/21/2017   CREATININE 0.73 05/21/2017   BUN 9 05/21/2017   CO2 26 05/21/2017   TSH 1.510 05/21/2017   INR 1.01 05/21/2017   HGBA1C 5.6 05/23/2017    Delight Ovens, MD 05/24/2017 7:23 AM

## 2017-05-25 ENCOUNTER — Inpatient Hospital Stay (HOSPITAL_COMMUNITY): Payer: BLUE CROSS/BLUE SHIELD

## 2017-05-25 ENCOUNTER — Encounter (HOSPITAL_COMMUNITY): Payer: Self-pay | Admitting: Cardiothoracic Surgery

## 2017-05-25 LAB — BASIC METABOLIC PANEL
Anion gap: 5 (ref 5–15)
BUN: 10 mg/dL (ref 6–20)
CALCIUM: 7.7 mg/dL — AB (ref 8.9–10.3)
CO2: 26 mmol/L (ref 22–32)
Chloride: 106 mmol/L (ref 101–111)
Creatinine, Ser: 0.6 mg/dL — ABNORMAL LOW (ref 0.61–1.24)
GFR calc Af Amer: >60 mL/min (ref >60)
GLUCOSE: 111 mg/dL — AB (ref 65–99)
Potassium: 4.6 mmol/L (ref 3.5–5.1)
Sodium: 137 mmol/L (ref 135–145)

## 2017-05-25 LAB — POCT I-STAT, CHEM 8
BUN: 14 mg/dL (ref 6–20)
Calcium, Ion: 1.12 mmol/L — ABNORMAL LOW (ref 1.15–1.40)
Chloride: 103 mmol/L (ref 101–111)
Creatinine, Ser: 0.5 mg/dL — ABNORMAL LOW (ref 0.61–1.24)
Glucose, Bld: 109 mg/dL — ABNORMAL HIGH (ref 65–99)
HCT: 34 % — ABNORMAL LOW (ref 39.0–52.0)
Hemoglobin: 11.6 g/dL — ABNORMAL LOW (ref 13.0–17.0)
Potassium: 4.2 mmol/L (ref 3.5–5.1)
Sodium: 139 mmol/L (ref 135–145)
TCO2: 24 mmol/L (ref 22–32)

## 2017-05-25 LAB — CBC
HCT: 37 % — ABNORMAL LOW (ref 39.0–52.0)
HCT: 37.2 % — ABNORMAL LOW (ref 39.0–52.0)
Hemoglobin: 11.9 g/dL — ABNORMAL LOW (ref 13.0–17.0)
Hemoglobin: 11.9 g/dL — ABNORMAL LOW (ref 13.0–17.0)
MCH: 27 pg (ref 26.0–34.0)
MCH: 27.2 pg (ref 26.0–34.0)
MCHC: 32.2 g/dL (ref 30.0–36.0)
MCHC: 32 g/dL (ref 30.0–36.0)
MCV: 84.1 fL (ref 78.0–100.0)
MCV: 84.9 fL (ref 78.0–100.0)
PLATELETS: 200 10*3/uL (ref 150–400)
PLATELETS: 174 10*3/uL (ref 150–400)
RBC: 4.4 MIL/uL (ref 4.22–5.81)
RBC: 4.38 MIL/uL (ref 4.22–5.81)
RDW: 14.4 % (ref 11.5–15.5)
RDW: 14.7 % (ref 11.5–15.5)
WBC: 13.7 10*3/uL — AB (ref 4.0–10.5)
WBC: 13.5 10*3/uL — ABNORMAL HIGH (ref 4.0–10.5)

## 2017-05-25 LAB — MAGNESIUM
Magnesium: 2.4 mg/dL (ref 1.7–2.4)
Magnesium: 2.6 mg/dL — ABNORMAL HIGH (ref 1.7–2.4)

## 2017-05-25 LAB — GLUCOSE, CAPILLARY
Glucose-Capillary: 111 mg/dL — ABNORMAL HIGH (ref 65–99)
Glucose-Capillary: 111 mg/dL — ABNORMAL HIGH (ref 65–99)
Glucose-Capillary: 112 mg/dL — ABNORMAL HIGH (ref 65–99)
Glucose-Capillary: 115 mg/dL — ABNORMAL HIGH (ref 65–99)
Glucose-Capillary: 119 mg/dL — ABNORMAL HIGH (ref 65–99)
Glucose-Capillary: 121 mg/dL — ABNORMAL HIGH (ref 65–99)
Glucose-Capillary: 125 mg/dL — ABNORMAL HIGH (ref 65–99)
Glucose-Capillary: 126 mg/dL — ABNORMAL HIGH (ref 65–99)
Glucose-Capillary: 147 mg/dL — ABNORMAL HIGH (ref 65–99)
Glucose-Capillary: 77 mg/dL (ref 65–99)

## 2017-05-25 LAB — CREATININE, SERUM
Creatinine, Ser: 0.61 mg/dL (ref 0.61–1.24)
GFR calc Af Amer: >60 mL/min (ref >60)
GFR calc non Af Amer: >60 mL/min (ref >60)

## 2017-05-25 MED ORDER — SODIUM CHLORIDE 0.9% FLUSH: 10.0000 mL | INTRAVENOUS | Status: DC | PRN

## 2017-05-25 MED ORDER — ENOXAPARIN SODIUM 30 MG/0.3ML ~~LOC~~ SOLN: 30.0000 mg | Freq: Every day | SUBCUTANEOUS | Status: DC

## 2017-05-25 MED ORDER — INSULIN ASPART 100 UNIT/ML ~~LOC~~ SOLN
0.0000 [IU] | SUBCUTANEOUS | Status: DC
  Administered 2017-05-25 – 2017-05-26 (×3): 2 [IU] via SUBCUTANEOUS

## 2017-05-25 MED ORDER — INSULIN ASPART 100 UNIT/ML ~~LOC~~ SOLN: 0.0000 [IU] | SUBCUTANEOUS | Status: DC

## 2017-05-25 MED ORDER — CHLORHEXIDINE GLUCONATE CLOTH 2 % EX PADS
6.0000 | MEDICATED_PAD | Freq: Every day | CUTANEOUS | Status: DC
  Administered 2017-05-25: 6 via TOPICAL

## 2017-05-25 MED ORDER — SODIUM CHLORIDE 0.9% FLUSH
10.0000 mL | Freq: Two times a day (BID) | INTRAVENOUS | Status: DC
  Administered 2017-05-25: 20 mL

## 2017-05-25 MED ORDER — ENOXAPARIN SODIUM 30 MG/0.3ML ~~LOC~~ SOLN
30.0000 mg | Freq: Every day | SUBCUTANEOUS | Status: DC
Start: 2017-05-25 — End: 2017-05-26
  Administered 2017-05-25: 30 mg via SUBCUTANEOUS
  Filled 2017-05-25: qty 0.3

## 2017-05-25 MED ORDER — INSULIN DETEMIR 100 UNIT/ML ~~LOC~~ SOLN
10.0000 [IU] | Freq: Once | SUBCUTANEOUS | Status: DC
  Filled 2017-05-25: qty 0.1

## 2017-05-25 MED FILL — Heparin Sodium (Porcine) Inj 1000 Unit/ML: INTRAMUSCULAR | Qty: 30 | Status: AC

## 2017-05-25 MED FILL — Potassium Chloride Inj 2 mEq/ML: INTRAVENOUS | Qty: 40 | Status: AC

## 2017-05-25 MED FILL — Magnesium Sulfate Inj 50%: INTRAMUSCULAR | Qty: 10 | Status: AC

## 2017-05-25 NOTE — Op Note (Signed)
NAMENIKALAS, BRAMEL MEDICAL RECORD [id number] ACCOUNT [id number] DATE OF BIRTH:[birth year] FACILITY: [facility] LOCATION: [facility] PHYSICIAN:Kristel Durkee Bari Ethne Jeon, MD  OPERATIVE REPORT  DATE OF PROCEDURE:  05/24/2017  PREOPERATIVE DIAGNOSIS:  Severe 3-vessel coronary artery disease with new onset of angina.  POSTOPERATIVE DIAGNOSIS:  Severe 3-vessel coronary artery disease with new onset of angina.  SURGICAL PROCEDURE:  Coronary artery bypass grafting x6 with the left internal mammary to the left anterior descending coronary artery, sequential reverse saphenous vein graft to the acute margin and posterior descending branches of the right coronary  artery, sequential reverse saphenous vein graft to the first and second obtuse marginal, reverse saphenous vein graft to the diagonal,  with right thigh and calf greater saphenous vein harvesting endoscopically.  SURGEON:  Sheliah Plane, MD  FIRST ASSISTANT:  Lowella Dandy, PA  BRIEF HISTORY:  The patient is a 67 year old male with no previous cardiac history who over the several months prior to surgery had noted vague chest discomfort with exertion.  At the time of his annual physical with Dr. Kevan Ny, he mentioned a stress test  and cardiology evaluation was arranged because of a positive stress test.  Cardiac catheterization was performed which demonstrated high grade III vessel coronary artery disease with complex 90% stenoses of the circumflex coronary artery, diffuse  disease throughout the right coronary artery with a small distal vessel, and a large proximal collateral to the very distal circumflex branch in the AV groove.  He had proximal and mid-LAD lesions of greater than 90%.  Overall, ventricular function was  preserved.  Because of the patient's complex 3-vessel coronary artery disease, positive stress test and symptoms, coronary artery bypass grafting was recommended.  Risks and options were discussed with the patient.  He was agreeable and  signed informed  consent.  DESCRIPTION OF PROCEDURE:  With Swan-Ganz, arterial line, and monitors in place, the patient underwent general endotracheal anesthesia without incident.  The skin on the chest and legs was prepped with Betadine and draped in the usual sterile manner.   Appropriate timeout was performed, and we proceeded with endoscopic vein harvesting of the right greater saphenous vein involving the thigh and calf.  The vein was of excellent quality and caliber.  A median sternotomy was performed.  The left internal  mammary artery was dissected down as a pedicle graft.  The distal artery was divided.  It had good free flow.  Pericardium was opened.  Overall, ventricular function appeared preserved.  This was confirmed by TEE placed by Dr. Hart Rochester.  The patient was  systemically heparinized.  The ascending aorta was cannulated.  The right atrium was cannulated.  An aortic root vent cardioplegia needle was introduced into the ascending aorta.  The patient was placed on cardiopulmonary bypass at 2.4 L/min per sq m.   Sites and anastomoses were selected and dissected at the epicardium.  The patient's distal right coronary artery was significantly calcified.  The posterior descending was a relatively small vessel but was dissected out of its place in the myocardial  fat.  The acute marginal branch was a relatively long vessel and of reasonable size.  The first obtuse marginal and the much larger second obtuse marginal were identified and were chosen for sites of bypass.  A very distal circumflex was high in the AV  groove and was very small as it arose from the AV groove.  The patient's body temperature was cooled to 32 degrees.  Aortic cross-clamp was applied.  Cold blood potassium cardioplegia 800  mL was administered antegrade.  Myocardial septal temperatures  were monitored throughout the cross-clamp.  We turned our attention to the right system.  The acute marginal vessel was opened and easily  admitted a 1 mm probe.  The vessel was 1.2 mm in size.  Using a diamond type side-to-side anastomosis with a running  7-0 Prolene, we then took the distal extent of the same vein to the posterior descending.  This vessel was opened.  It was approximately 1.2 mm in size.  Using a running 8-0 Prolene, a distal anastomosis was performed.  Additional cold blood  cardioplegia was administered down the vein grafts as we progressed.  The heart was then elevated, and the first obtuse marginal vessel was opened.  This vessel admitted a 1.5 mm probe.  Using a diamond-type side-to-side, anastomosis was carried out with  a segment of reverse saphenous vein graft and a 7-0 Prolene.  The distal extent of the same vein was then trimmed and anastomosed to the larger second obtuse marginal.  This vessel was somewhat thickened but was approximately 1.3 to 1.5 mm in size.   Using a running 7-0 Prolene, distal anastomosis was performed.  Attention was then turned to the diagonal coronary artery which was opened and admitted a 1.5 mm probe.  Using a running 7-0 Prolene, distal anastomosis was performed with a segment of  reverse saphenous vein graft.  The midportion of the LAD was intramyocardial.  The distal third of the LAD was identified, opened, and admitted a 1.5 mm probe proximally and distally.  Using a running 8-0 Prolene, the left internal mammary artery was  anastomosed to the left anterior descending coronary artery.  With the release of the bulldog on the mammary artery, there was a prompt rise in myocardial septal temperature.  The bulldog was placed back on the mammary artery, the fascia was tacked to  the epicardium, and we proceeded with the cross-clamp still in place to perform 3 punch aortotomies in the ascending aorta, and each of the 3 vein grafts were anastomosed to the ascending aorta.  The heart was allowed to passively fill and deair.  The  bulldog was removed from the mammary artery with a prompt rise in  myocardial septal temperature.  The proximal anastomoses were completed, and the aortic cross-clamp was removed with a total cross-clamp time of 124 minutes.  The patient required  electrical defibrillation.  It turned to a sinus rhythm.  Sites of anastomoses were inspected and free of bleeding.  The patient was then ventilated and weaned from cardiopulmonary bypass on low-dose dopamine.  He remained hemodynamically stable.  He was  decannulated in the usual fashion.  Protamine sulfate was administered with the operative field hemostatic.  Atrial and ventricular pacing wires were applied.  Graft markers were applied.  A left pleural tube and a Blake mediastinal drain were left in  place.  The pericardium was loosely reapproximated.  The sternum was closed with #6 stainless steel wire.  The fascia was closed with interrupted 0 Vicryl, running 3-0 Vicryl in the subcutaneous tissue, 3-0 subcuticular stitch in the skin edges.  Dry  dressings were applied.  Sponge and needle count was reported as correct at completion of the procedure.  The patient tolerated the procedure without obvious complication.  He did not require any blood bank blood products during the operative procedure.   He was transferred to the surgical intensive care unit for further postoperative care.  The total pump time was 179 minutes.  LN/NUANCE  D:05/25/2017 T:05/25/2017 JOB:[id number]

## 2017-05-25 NOTE — Progress Notes (Addendum)
Patient ID: Tyrone Sherman, male   DOB: [birth year], 67 y.o.   MRN: [id number] TCTS DAILY ICU PROGRESS NOTE                   [address]            [place] [address]          [phone]   1 Day Post-Op Procedure(s) (LRB): CORONARY ARTERY BYPASS GRAFTING (CABG) (N/A) TRANSESOPHAGEAL ECHOCARDIOGRAM (TEE) (N/A)  Total Length of Stay:  LOS: 5 days   Subjective: Patient awake alert neurologically intact extubated last night  Objective: Vital signs in last 24 hours: Temp:  [97 F (36.1 C)-99.5 F (37.5 C)] 99 F (37.2 C) (05/08 0700) Pulse Rate:  [75-94] 75 (05/08 0700) Cardiac Rhythm: Normal sinus rhythm (05/08 0400) Resp:  [12-27] 18 (05/08 0700) BP: (84-121)/(41-76) 88/62 (05/08 0700) SpO2:  [94 %-100 %] 95 % (05/08 0700) Arterial Line BP: (88-131)/(37-69) 105/49 (05/08 0700) FiO2 (%):  [40 %-50 %] 40 % (05/07 1655) Weight:  [213 lb 3 oz (96.7 kg)] 213 lb 3 oz (96.7 kg) (05/08 0500)  Filed Weights   05/23/17 0549 05/24/17 0523 05/25/17 0500  Weight: 203 lb 8 oz (92.3 kg) 202 lb 12.8 oz (92 kg) 213 lb 3 oz (96.7 kg)    Weight change: 10 lb 6.2 oz (4.711 kg)   Hemodynamic parameters for last 24 hours: PAP: (19-41)/(4-17) 31/11 CO:  [3.7 L/min-7.8 L/min] 7.8 L/min CI:  [2.5 L/min/m2-5.2 L/min/m2] 5.2 L/min/m2  Intake/Output from previous day: 05/07 0701 - 05/08 0700 In: 5931.6 [P.O.:120; I.V.:3611.6; Blood:900; IV Piggyback:1300] Out: 5955 [Urine:3975; Blood:900; Chest Tube:410]  Intake/Output this shift: No intake/output data recorded.  Current Meds: Scheduled Meds: . acetaminophen  1,000 mg Oral Q6H   Or  . acetaminophen (TYLENOL) oral liquid 160 mg/5 mL  1,000 mg Per Tube Q6H  . aspirin EC  325 mg Oral Daily   Or  . aspirin  324 mg Per Tube Daily  . bisacodyl  10 mg Oral Daily   Or  . bisacodyl  10 mg Rectal Daily  . citalopram  40 mg Oral Daily  . docusate sodium  200 mg Oral Daily  . insulin regular  0-10 Units Intravenous TID WC  .  metoCLOPramide (REGLAN) injection  10 mg Intravenous Q6H  . metoprolol tartrate  12.5 mg Oral BID   Or  . metoprolol tartrate  12.5 mg Per Tube BID  . pantoprazole  40 mg Oral Daily  . rosuvastatin  40 mg Oral Daily  . sodium chloride flush  3 mL Intravenous Q12H   Continuous Infusions: . sodium chloride    . sodium chloride    . sodium chloride    . albumin human    . cefUROXime (ZINACEF)  IV Stopped (05/25/17 0530)  . dexmedetomidine (PRECEDEX) IV infusion Stopped (05/24/17 1615)  . DOPamine 3 mcg/kg/min (05/24/17 1900)  . insulin (NOVOLIN-R) infusion 1.3 Units/hr (05/25/17 0651)  . lactated ringers    . lactated ringers 20 mL/hr at 05/24/17 2314  . lactated ringers    . nitroGLYCERIN Stopped (05/24/17 1515)  . phenylephrine (NEO-SYNEPHRINE) Adult infusion 20 mcg/min (05/25/17 0616)   PRN Meds:.sodium chloride, albumin human, ALPRAZolam, lactated ringers, metoprolol tartrate, midazolam, morphine injection, ondansetron (ZOFRAN) IV, oxyCODONE, sodium chloride flush, traMADol  General appearance: alert, cooperative and no distress Neurologic: intact Heart: regular rate and rhythm, S1, S2 normal, no murmur, click, rub or gallop Lungs: diminished breath sounds bibasilar Abdomen: soft,  non-tender; bowel sounds normal; no masses,  no organomegaly Extremities: extremities normal, atraumatic, no cyanosis or edema and Homans sign is negative, no sign of DVT Wound: Sternum intact dressing  Lab Results: CBC: Recent Labs    05/24/17 1959 05/24/17 2008 05/25/17 0417  WBC 15.6*  --  13.7*  HGB 12.3* 11.9* 11.9*  HCT 37.2* 35.0* 37.0*  PLT 206  --  200   BMET:  Recent Labs    05/24/17 2008 05/25/17 0417  NA 139 137  K 4.3 4.6  CL 103 106  CO2  --  26  GLUCOSE 125* 111*  BUN 10 10  CREATININE 0.50* 0.60*  CALCIUM  --  7.7*    CMET: Lab Results  Component Value Date   WBC 13.7 (H) 05/25/2017   HGB 11.9 (L) 05/25/2017   HCT 37.0 (L) 05/25/2017   PLT 200 05/25/2017    GLUCOSE 111 (H) 05/25/2017   CHOL 192 05/21/2017   TRIG 86 05/21/2017   HDL 42 05/21/2017   LDLCALC 133 (H) 05/21/2017   ALT 30 05/21/2017   AST 21 05/21/2017   NA 137 05/25/2017   K 4.6 05/25/2017   CL 106 05/25/2017   CREATININE 0.60 (L) 05/25/2017   BUN 10 05/25/2017   CO2 26 05/25/2017   TSH 1.510 05/21/2017   INR 1.31 05/24/2017   HGBA1C 5.6 05/23/2017      PT/INR:  Recent Labs    05/24/17 1437  LABPROT 16.1*  INR 1.31   Radiology: Dg Chest Port 1 View Dg Chest Port 1 View  Result Date: 05/25/2017 CLINICAL DATA:  Recent coronary bypass grafting with shortness of breath EXAM: PORTABLE CHEST 1 VIEW COMPARISON:  05/24/2017 FINDINGS: Endotracheal tube is been removed in the interval as has the nasogastric catheter. Swan-Ganz catheter is noted in the pulmonary outflow tract. Mediastinal drain and left thoracostomy catheter remain. No pneumothorax is noted. Some left basilar atelectasis remains. No new focal abnormality is seen. IMPRESSION: Tubes and lines as described above. Persistent left basilar changes are seen. Electronically Signed   By: Alcide Clever M.D.   On: 05/25/2017 07:58    Assessment/Plan: S/P Procedure(s) (LRB): CORONARY ARTERY BYPASS GRAFTING (CABG) (N/A) TRANSESOPHAGEAL ECHOCARDIOGRAM (TEE) (N/A) Mobilize Diuresis Diabetes control d/c tubes/lines See progression orders Expected Acute  Blood - loss Anemia- continue to monitor  Renal function stable  dopamine to wean off   Delight Ovens 05/25/2017 7:54 AM

## 2017-05-25 NOTE — Plan of Care (Signed)
  Problem: Cardiac: Goal: Hemodynamic stability will improve Outcome: Progressing   Problem: Respiratory: Goal: Respiratory status will improve Outcome: Progressing

## 2017-05-25 NOTE — Anesthesia Postprocedure Evaluation (Signed)
Anesthesia Post Note  Patient: AVIK LEONI  Procedure(s) Performed: CORONARY ARTERY BYPASS GRAFTING (CABG) (N/A Chest) TRANSESOPHAGEAL ECHOCARDIOGRAM (TEE) (N/A )     Patient location during evaluation: SICU Anesthesia Type: General Level of consciousness: awake Pain management: pain level controlled Vital Signs Assessment: post-procedure vital signs reviewed and stable Respiratory status: patient remains intubated per anesthesia plan Cardiovascular status: stable Postop Assessment: no apparent nausea or vomiting Anesthetic complications: no Comments: Pt doing well. Minor pain. Weaning phenylephrine gtt.     Last Vitals:  Vitals:   05/25/17 1127 05/25/17 1145  BP:    Pulse:  74  Resp:  (!) 21  Temp: 37.1 C   SpO2:  96%    Last Pain:  Vitals:   05/25/17 1136  TempSrc:   PainSc: 3                  Shelton Silvas

## 2017-05-25 NOTE — Plan of Care (Signed)
  Problem: Education: Goal: Knowledge of disease or condition will improve Outcome: Progressing Goal: Knowledge of the prescribed therapeutic regimen will improve Outcome: Progressing   Problem: Activity: Goal: Risk for activity intolerance will decrease Outcome: Progressing   Problem: Cardiac: Goal: Hemodynamic stability will improve Outcome: Progressing   Problem: Clinical Measurements: Goal: Postoperative complications will be avoided or minimized Outcome: Progressing   Problem: Respiratory: Goal: Respiratory status will improve Outcome: Progressing

## 2017-05-25 NOTE — Progress Notes (Signed)
      [address]       [place] [address]             [phone]      POD # 1 CABG  Sleeping currently  BP 115/67   Pulse 82   Temp 99.7 F (37.6 C) (Oral)   Resp 20   Ht  (1.778 m)   Wt 213 lb 3 oz (96.7 kg)   SpO2 97%   BMI 30.59 kg/m   Intake/Output Summary (Last 24 hours) at 05/25/2017 1949 Last data filed at 05/25/2017 1800 Gross per 24 hour  Intake 2466.84 ml  Output 2030 ml  Net 436.84 ml   Cr= 0.6, Hct= 37  Doing well POD # 1  Rylinn Linzy C. Dorris Fetch, MD [facility] [phone]

## 2017-05-26 ENCOUNTER — Inpatient Hospital Stay (HOSPITAL_COMMUNITY): Payer: BLUE CROSS/BLUE SHIELD

## 2017-05-26 LAB — GLUCOSE, CAPILLARY
Glucose-Capillary: 112 mg/dL — ABNORMAL HIGH (ref 65–99)
Glucose-Capillary: 118 mg/dL — ABNORMAL HIGH (ref 65–99)
Glucose-Capillary: 91 mg/dL (ref 65–99)
Glucose-Capillary: 94 mg/dL (ref 65–99)
Glucose-Capillary: 117 mg/dL — ABNORMAL HIGH (ref 65–99)

## 2017-05-26 LAB — BASIC METABOLIC PANEL
Anion gap: 4 — ABNORMAL LOW (ref 5–15)
BUN: 12 mg/dL (ref 6–20)
CO2: 29 mmol/L (ref 22–32)
Calcium: 8.1 mg/dL — ABNORMAL LOW (ref 8.9–10.3)
Chloride: 104 mmol/L (ref 101–111)
Creatinine, Ser: 0.61 mg/dL (ref 0.61–1.24)
GFR calc Af Amer: >60 mL/min (ref >60)
GFR calc non Af Amer: >60 mL/min (ref >60)
Glucose, Bld: 116 mg/dL — ABNORMAL HIGH (ref 65–99)
Potassium: 4.6 mmol/L (ref 3.5–5.1)
Sodium: 137 mmol/L (ref 135–145)

## 2017-05-26 LAB — CBC
HCT: 34.1 % — ABNORMAL LOW (ref 39.0–52.0)
Hemoglobin: 10.6 g/dL — ABNORMAL LOW (ref 13.0–17.0)
MCH: 26.7 pg (ref 26.0–34.0)
MCHC: 31.1 g/dL (ref 30.0–36.0)
MCV: 85.9 fL (ref 78.0–100.0)
Platelets: 141 10*3/uL — ABNORMAL LOW (ref 150–400)
RBC: 3.97 MIL/uL — ABNORMAL LOW (ref 4.22–5.81)
RDW: 14.7 % (ref 11.5–15.5)
WBC: 13.1 10*3/uL — ABNORMAL HIGH (ref 4.0–10.5)

## 2017-05-26 MED ORDER — INSULIN ASPART 100 UNIT/ML ~~LOC~~ SOLN
0.0000 [IU] | Freq: Three times a day (TID) | SUBCUTANEOUS | Status: DC
  Administered 2017-05-27: 2 [IU] via SUBCUTANEOUS

## 2017-05-26 MED ORDER — METOPROLOL TARTRATE 12.5 MG HALF TABLET
12.5000 mg | ORAL_TABLET | Freq: Two times a day (BID) | ORAL | Status: DC
  Administered 2017-05-26 – 2017-05-28 (×5): 12.5 mg via ORAL
  Filled 2017-05-26 (×5): qty 1

## 2017-05-26 MED ORDER — MOVING RIGHT ALONG BOOK
Freq: Once | Status: AC
  Administered 2017-05-26: 12:00:00
  Filled 2017-05-26: qty 1

## 2017-05-26 MED ORDER — ORAL CARE MOUTH RINSE: 15.0000 mL | Freq: Two times a day (BID) | OROMUCOSAL | Status: DC

## 2017-05-26 MED ORDER — SODIUM CHLORIDE 0.9% FLUSH: 3.0000 mL | INTRAVENOUS | Status: DC | PRN

## 2017-05-26 MED ORDER — BISACODYL 10 MG RE SUPP: 10.0000 mg | Freq: Every day | RECTAL | Status: DC | PRN

## 2017-05-26 MED ORDER — OXYCODONE HCL 5 MG PO TABS: 5.0000 mg | ORAL_TABLET | ORAL | Status: DC | PRN

## 2017-05-26 MED ORDER — ONDANSETRON HCL 4 MG/2ML IJ SOLN: 4.0000 mg | Freq: Four times a day (QID) | INTRAMUSCULAR | Status: DC | PRN

## 2017-05-26 MED ORDER — DOCUSATE SODIUM 100 MG PO CAPS
200.0000 mg | ORAL_CAPSULE | Freq: Every day | ORAL | Status: DC
  Administered 2017-05-28: 200 mg via ORAL
  Filled 2017-05-26 (×3): qty 2

## 2017-05-26 MED ORDER — SODIUM CHLORIDE 0.9 % IV SOLN: 250.0000 mL | INTRAVENOUS | Status: DC | PRN

## 2017-05-26 MED ORDER — ASPIRIN EC 325 MG PO TBEC
325.0000 mg | DELAYED_RELEASE_TABLET | Freq: Every day | ORAL | Status: DC
  Administered 2017-05-26 – 2017-05-28 (×3): 325 mg via ORAL
  Filled 2017-05-26 (×3): qty 1

## 2017-05-26 MED ORDER — ACETAMINOPHEN 325 MG PO TABS
650.0000 mg | ORAL_TABLET | Freq: Four times a day (QID) | ORAL | Status: DC | PRN
  Administered 2017-05-26: 650 mg via ORAL
  Filled 2017-05-26: qty 2

## 2017-05-26 MED ORDER — BISACODYL 5 MG PO TBEC: 10.0000 mg | DELAYED_RELEASE_TABLET | Freq: Every day | ORAL | Status: DC | PRN

## 2017-05-26 MED ORDER — SODIUM CHLORIDE 0.9% FLUSH
3.0000 mL | Freq: Two times a day (BID) | INTRAVENOUS | Status: DC
  Administered 2017-05-26 – 2017-05-27 (×3): 3 mL via INTRAVENOUS

## 2017-05-26 MED ORDER — GUAIFENESIN ER 600 MG PO TB12: 600.0000 mg | ORAL_TABLET | Freq: Two times a day (BID) | ORAL | Status: DC | PRN

## 2017-05-26 MED ORDER — POTASSIUM CHLORIDE CRYS ER 10 MEQ PO TBCR
10.0000 meq | EXTENDED_RELEASE_TABLET | Freq: Two times a day (BID) | ORAL | Status: AC
  Administered 2017-05-26 – 2017-05-27 (×3): 10 meq via ORAL
  Filled 2017-05-26 (×3): qty 1

## 2017-05-26 MED ORDER — FUROSEMIDE 40 MG PO TABS
40.0000 mg | ORAL_TABLET | Freq: Every day | ORAL | Status: DC
  Administered 2017-05-27 – 2017-05-28 (×2): 40 mg via ORAL
  Filled 2017-05-26 (×2): qty 1

## 2017-05-26 MED ORDER — ONDANSETRON HCL 4 MG PO TABS
4.0000 mg | ORAL_TABLET | Freq: Four times a day (QID) | ORAL | Status: DC | PRN
Start: 2017-05-26 — End: 2017-05-28

## 2017-05-26 MED ORDER — FOLIC ACID 1 MG PO TABS
1.0000 mg | ORAL_TABLET | Freq: Every day | ORAL | Status: DC
  Administered 2017-05-26 – 2017-05-28 (×3): 1 mg via ORAL
  Filled 2017-05-26 (×3): qty 1

## 2017-05-26 MED ORDER — TRAMADOL HCL 50 MG PO TABS: 50.0000 mg | ORAL_TABLET | ORAL | Status: DC | PRN

## 2017-05-26 MED ORDER — FUROSEMIDE 10 MG/ML IJ SOLN
40.0000 mg | Freq: Once | INTRAMUSCULAR | Status: AC
  Administered 2017-05-26: 40 mg via INTRAVENOUS
  Filled 2017-05-26: qty 4

## 2017-05-26 NOTE — Progress Notes (Signed)
Pt received from 2H. VSS. CHG complete. Telemetry applied. Pt oriented to room and unit. Family updated on transfer. Call light in reach. Will continue to monitor.  Versie Starks, RN

## 2017-05-26 NOTE — Progress Notes (Signed)
Patient ID: Tyrone Sherman, male   DOB: 06/18/1950, 67 y.o.   MRN: [id number] TCTS DAILY ICU PROGRESS NOTE                   [address]            [place] [address]          [phone]   2 Days Post-Op Procedure(s) (LRB): CORONARY ARTERY BYPASS GRAFTING (CABG) (N/A) TRANSESOPHAGEAL ECHOCARDIOGRAM (TEE) (N/A)  Total Length of Stay:  LOS: 6 days   Subjective: Patient awake alert neurologically intact, ambulating around the unit this morning  Objective: Vital signs in last 24 hours: Temp:  [97.7 F (36.5 C)-99.7 F (37.6 C)] 99.2 F (37.3 C) (05/09 0726) Pulse Rate:  [73-99] 84 (05/09 0700) Cardiac Rhythm: Normal sinus rhythm (05/09 0400) Resp:  [16-33] 26 (05/09 0700) BP: (94-121)/(47-72) 101/65 (05/09 0700) SpO2:  [87 %-99 %] 90 % (05/09 0700) Arterial Line BP: (98-131)/(49-61) 114/54 (05/08 1500) Weight:  [204 lb 12.9 oz (92.9 kg)] 204 lb 12.9 oz (92.9 kg) (05/09 0600)  Filed Weights   05/24/17 0523 05/25/17 0500 05/26/17 0600  Weight: 202 lb 12.8 oz (92 kg) 213 lb 3 oz (96.7 kg) 204 lb 12.9 oz (92.9 kg)    Weight change: -8 lb 6 oz (-3.8 kg)   Hemodynamic parameters for last 24 hours: PAP: (33-38)/(10-15) 38/15  Intake/Output from previous day: 05/08 0701 - 05/09 0700 In: 1960.2 [P.O.:1300; I.V.:460.2; IV Piggyback:200] Out: 1000 [Urine:950; Chest Tube:50]  Intake/Output this shift: Total I/O In: -  Out: 75 [Urine:75]  Current Meds: Scheduled Meds: . acetaminophen  1,000 mg Oral Q6H   Or  . acetaminophen (TYLENOL) oral liquid 160 mg/5 mL  1,000 mg Per Tube Q6H  . aspirin EC  325 mg Oral Daily   Or  . aspirin  324 mg Per Tube Daily  . bisacodyl  10 mg Oral Daily   Or  . bisacodyl  10 mg Rectal Daily  . Chlorhexidine Gluconate Cloth  6 each Topical Daily  . citalopram  40 mg Oral Daily  . docusate sodium  200 mg Oral Daily  . enoxaparin (LOVENOX) injection  30 mg Subcutaneous QHS  . insulin aspart  0-24 Units Subcutaneous Q4H  .  mouth rinse  15 mL Mouth Rinse BID  . metoprolol tartrate  12.5 mg Oral BID   Or  . metoprolol tartrate  12.5 mg Per Tube BID  . pantoprazole  40 mg Oral Daily  . rosuvastatin  40 mg Oral Daily  . sodium chloride flush  10-40 mL Intracatheter Q12H  . sodium chloride flush  3 mL Intravenous Q12H   Continuous Infusions: . sodium chloride Stopped (05/25/17 1155)  . sodium chloride Stopped (05/25/17 1155)  . sodium chloride Stopped (05/25/17 1155)  . dexmedetomidine (PRECEDEX) IV infusion Stopped (05/24/17 1615)  . lactated ringers Stopped (05/25/17 1156)  . lactated ringers 20 mL/hr at 05/25/17 0800  . lactated ringers Stopped (05/25/17 2215)  . nitroGLYCERIN Stopped (05/24/17 1515)  . phenylephrine (NEO-SYNEPHRINE) Adult infusion Stopped (05/25/17 1110)   PRN Meds:.sodium chloride, ALPRAZolam, lactated ringers, metoprolol tartrate, midazolam, morphine injection, ondansetron (ZOFRAN) IV, oxyCODONE, sodium chloride flush, sodium chloride flush, traMADol  General appearance: alert, cooperative and no distress Neurologic: intact Heart: regular rate and rhythm, S1, S2 normal, no murmur, click, rub or gallop Lungs: diminished breath sounds bibasilar Abdomen: soft, non-tender; bowel sounds normal; no masses,  no organomegaly Extremities: extremities normal, atraumatic, no cyanosis or  edema and Homans sign is negative, no sign of DVT Wound: Dressing intact sternum stable  Lab Results: CBC: Recent Labs    05/25/17 1534 05/26/17 0509  WBC 13.5* 13.1*  HGB 11.9* 10.6*  HCT 37.2* 34.1*  PLT 174 141*   BMET:  Recent Labs    05/25/17 0417 05/25/17 1533 05/25/17 1534 05/26/17 0509  NA 137 139  --  137  K 4.6 4.2  --  4.6  CL 106 103  --  104  CO2 26  --   --  29  GLUCOSE 111* 109*  --  116*  BUN 10 14  --  12  CREATININE 0.60* 0.50* 0.61 0.61  CALCIUM 7.7*  --   --  8.1*    CMET: Lab Results  Component Value Date   WBC 13.1 (H) 05/26/2017   HGB 10.6 (L) 05/26/2017   HCT  34.1 (L) 05/26/2017   PLT 141 (L) 05/26/2017   GLUCOSE 116 (H) 05/26/2017   CHOL 192 05/21/2017   TRIG 86 05/21/2017   HDL 42 05/21/2017   LDLCALC 133 (H) 05/21/2017   ALT 30 05/21/2017   AST 21 05/21/2017   NA 137 05/26/2017   K 4.6 05/26/2017   CL 104 05/26/2017   CREATININE 0.61 05/26/2017   BUN 12 05/26/2017   CO2 29 05/26/2017   TSH 1.510 05/21/2017   INR 1.31 05/24/2017   HGBA1C 5.6 05/23/2017      PT/INR:  Recent Labs    05/24/17 1437  LABPROT 16.1*  INR 1.31   Radiology: No results found.   Assessment/Plan: S/P Procedure(s) (LRB): CORONARY ARTERY BYPASS GRAFTING (CABG) (N/A) TRANSESOPHAGEAL ECHOCARDIOGRAM (TEE) (N/A) Mobilize Diuresis Plan for transfer to step-down: see transfer orders Stable postop remains in sinus rhythm, renal function stable    Delight Ovens 05/26/2017 8:01 AM

## 2017-05-27 ENCOUNTER — Inpatient Hospital Stay (HOSPITAL_COMMUNITY): Payer: BLUE CROSS/BLUE SHIELD

## 2017-05-27 LAB — BASIC METABOLIC PANEL
Anion gap: 8 (ref 5–15)
BUN: 16 mg/dL (ref 6–20)
CO2: 27 mmol/L (ref 22–32)
Calcium: 8.1 mg/dL — ABNORMAL LOW (ref 8.9–10.3)
Chloride: 100 mmol/L — ABNORMAL LOW (ref 101–111)
Creatinine, Ser: 0.72 mg/dL (ref 0.61–1.24)
GFR calc Af Amer: >60 mL/min (ref >60)
GFR calc non Af Amer: >60 mL/min (ref >60)
Glucose, Bld: 110 mg/dL — ABNORMAL HIGH (ref 65–99)
Potassium: 4.2 mmol/L (ref 3.5–5.1)
Sodium: 135 mmol/L (ref 135–145)

## 2017-05-27 LAB — CBC
HCT: 32.2 % — ABNORMAL LOW (ref 39.0–52.0)
Hemoglobin: 10.5 g/dL — ABNORMAL LOW (ref 13.0–17.0)
MCH: 28.2 pg (ref 26.0–34.0)
MCHC: 32.6 g/dL (ref 30.0–36.0)
MCV: 86.3 fL (ref 78.0–100.0)
Platelets: 166 10*3/uL (ref 150–400)
RBC: 3.73 MIL/uL — ABNORMAL LOW (ref 4.22–5.81)
RDW: 15.1 % (ref 11.5–15.5)
WBC: 11.3 10*3/uL — ABNORMAL HIGH (ref 4.0–10.5)

## 2017-05-27 LAB — GLUCOSE, CAPILLARY
Glucose-Capillary: 74 mg/dL (ref 65–99)
Glucose-Capillary: 98 mg/dL (ref 65–99)
Glucose-Capillary: 85 mg/dL (ref 65–99)
Glucose-Capillary: 131 mg/dL — ABNORMAL HIGH (ref 65–99)

## 2017-05-27 MED FILL — Heparin Sodium (Porcine) Inj 1000 Unit/ML: INTRAMUSCULAR | Qty: 60 | Status: AC

## 2017-05-27 MED FILL — Lidocaine HCl(Cardiac) IV PF Soln Pref Syr 100 MG/5ML (2%): INTRAVENOUS | Qty: 10 | Status: AC

## 2017-05-27 MED FILL — Heparin Sodium (Porcine) Inj 1000 Unit/ML: INTRAMUSCULAR | Qty: 30 | Status: AC

## 2017-05-27 MED FILL — Mannitol IV Soln 20%: INTRAVENOUS | Qty: 500 | Status: AC

## 2017-05-27 MED FILL — Sodium Bicarbonate IV Soln 8.4%: INTRAVENOUS | Qty: 50 | Status: AC

## 2017-05-27 MED FILL — Sodium Chloride IV Soln 0.9%: INTRAVENOUS | Qty: 2000 | Status: AC

## 2017-05-27 MED FILL — Electrolyte-R (PH 7.4) Solution: INTRAVENOUS | Qty: 4000 | Status: AC

## 2017-05-27 NOTE — Care Management Note (Signed)
Case Management Note Donn Pierini RN, BSN Unit 4E-Case Manager [phone]  Patient Details  Name: DANARIUS MCCONATHY MRN: [id number] Date of Birth: 11-30-1950  Subjective/Objective:   Pt admitted with abd stress test s/p CABGx6                Action/Plan: PTA pt lived at home with wife- anticipate return home, pt has home 02 at home that he wears at night. CM to follow for transition of care needs.  Expected Discharge Date:                  Expected Discharge Plan:  Home/Self Care  In-House Referral:     Discharge planning Services  CM Consult  Post Acute Care Choice:    Choice offered to:     DME Arranged:    DME Agency:     HH Arranged:    HH Agency:     Status of Service:  In process, will continue to follow  If discussed at Long Length of Stay Meetings, dates discussed:   Discharge Disposition:    Additional Comments:  Darrold Span, RN 05/27/2017, 4:16 PM

## 2017-05-27 NOTE — Progress Notes (Signed)
Pacing wires removed per order. Patient instructed on bedrest and vital sign protocol initiated. 

## 2017-05-27 NOTE — Progress Notes (Addendum)
CARDIAC REHAB PHASE I   PRE:  Rate/Rhythm: 89 SR    BP: sitting 138/78    SaO2: 97 2L, 96 RA  MODE:  Ambulation: 470 ft   POST:  Rate/Rhythm: 100 ST    BP: sitting 147/89     SaO2: 93 RA  Pt moving well. Able to stand independently and walk with supervision. No RW needed although he has one at home if he does want to use it. SaO2 good on RA so took O2 off. ? If he needs O2 while lying flat (apparently did earlier). Ed completed with pt and family. Good reception. Will send referral to [facility]. [phone]  Harriet Masson CES, ACSM 05/27/2017 11:04 AM

## 2017-05-27 NOTE — Progress Notes (Addendum)
[address]       [place] [address]             [phone]      3 Days Post-Op Procedure(s) (LRB): CORONARY ARTERY BYPASS GRAFTING (CABG) (N/A) TRANSESOPHAGEAL ECHOCARDIOGRAM (TEE) (N/A) Subjective: Feels pretty well overall  Objective: Vital signs in last 24 hours: Temp:  [98.8 F (37.1 C)-99.7 F (37.6 C)] 98.8 F (37.1 C) (05/10 0343) Pulse Rate:  [80-99] 99 (05/09 1727) Cardiac Rhythm: Normal sinus rhythm (05/09 2000) Resp:  [19-30] 22 (05/09 1727) BP: (96-134)/(64-78) 121/74 (05/10 0343) SpO2:  [86 %-96 %] 95 % (05/09 1727) Weight:  [96 kg (211 lb 11.2 oz)-96.3 kg (212 lb 6.4 oz)] 96 kg (211 lb 11.2 oz) (05/10 0609)  Hemodynamic parameters for last 24 hours:    Intake/Output from previous day: 05/09 0701 - 05/10 0700 In: 570 [P.O.:570] Out: 777 [Urine:775; Stool:2] Intake/Output this shift: No intake/output data recorded.  General appearance: alert, cooperative and no distress Heart: regular rate and rhythm Lungs: dim in bases Abdomen: benign Extremities: minor edema Wound: evh sites healing well, chest dressing intact  Lab Results: Recent Labs    05/26/17 0509 05/27/17 0426  WBC 13.1* 11.3*  HGB 10.6* 10.5*  HCT 34.1* 32.2*  PLT 141* 166   BMET:  Recent Labs    05/26/17 0509 05/27/17 0426  NA 137 135  K 4.6 4.2  CL 104 100*  CO2 29 27  GLUCOSE 116* 110*  BUN 12 16  CREATININE 0.61 0.72  CALCIUM 8.1* 8.1*    PT/INR:  Recent Labs    05/24/17 1437  LABPROT 16.1*  INR 1.31   ABG    Component Value Date/Time   PHART 7.337 (L) 05/24/2017 1840   HCO3 24.6 05/24/2017 1840   TCO2 24 05/25/2017 1533   ACIDBASEDEF 2.0 05/24/2017 1840   O2SAT 98.0 05/24/2017 1840   CBG (last 3)  Recent Labs    05/26/17 1620 05/26/17 2224 05/27/17 0616  GLUCAP 94 117* 74    Meds Scheduled Meds: . aspirin EC  325 mg Oral Daily  . citalopram  40 mg Oral Daily  . docusate sodium  200 mg Oral Daily  . folic acid  1 mg  Oral Daily  . furosemide  40 mg Oral Daily  . insulin aspart  0-24 Units Subcutaneous TID AC & HS  . metoprolol tartrate  12.5 mg Oral BID  . pantoprazole  40 mg Oral Daily  . potassium chloride  10 mEq Oral BID  . rosuvastatin  40 mg Oral Daily  . sodium chloride flush  3 mL Intravenous Q12H   Continuous Infusions: . sodium chloride     PRN Meds:.sodium chloride, acetaminophen, bisacodyl **OR** bisacodyl, guaiFENesin, ondansetron **OR** ondansetron (ZOFRAN) IV, oxyCODONE, sodium chloride flush, traMADol  Xrays Dg Chest Port 1 View  Result Date: 05/26/2017 CLINICAL DATA:  Status post coronary bypass graft. EXAM: PORTABLE CHEST 1 VIEW COMPARISON:  Radiograph of May 25, 2017. FINDINGS: Stable cardiomegaly. Swan-Ganz catheter has been removed. Right internal jugular venous sheath remains. No pneumothorax is noted. Left-sided chest tube has been removed. Minimal right basilar subsegmental atelectasis is noted. Stable left basilar atelectasis is noted with probable pleural effusion. Bony thorax is unremarkable. IMPRESSION: Left-sided chest tube has been removed without pneumothorax. Stable mild left basilar atelectasis with effusion is noted. Electronically Signed   By: Lupita Raider, M.D.   On: 05/26/2017 10:43    Assessment/Plan: S/P Procedure(s) (LRB): CORONARY  ARTERY BYPASS GRAFTING (CABG) (N/A) TRANSESOPHAGEAL ECHOCARDIOGRAM (TEE) (N/A)  1 doing well 2 hemodyn stable in sinus rhythm, BP prob too low yet for ace/arb- will follow closely. Wean O2 off, push rehab and pulm toilet 3 H/H down slightly from previous but fine, leukocytosis is improved, platelets improved 4 renal fxn is stable, cont gentle diuresis 5 BS is well controlled, A1c 5.6 so should be able to control with diet 6 d/c wires today 7 home prob 1-2 days      LOS: 7 days    Rowe Clack 05/27/2017  Holding sinus , feels well, plan d/c tomorrow Wires out  I have seen and examined Tyrone Sherman and agree with the  above assessment  and plan.  Delight Ovens MD [employer] [phone] Office [phone] 05/27/2017 2:27 PM

## 2017-05-27 NOTE — Discharge Instructions (Signed)
Endoscopic Saphenous Vein Harvesting, Care After °Refer to this sheet in the next few weeks. These instructions provide you with information about caring for yourself after your procedure. Your health care provider may also give you more specific instructions. Your treatment has been planned according to current medical practices, but problems sometimes occur. Call your health care provider if you have any problems or questions after your procedure. °What can I expect after the procedure? °After the procedure, it is common to have: °· Pain. °· Bruising. °· Swelling. °· Numbness. ° °Follow these instructions at home: °Medicine °· Take over-the-counter and prescription medicines only as told by your health care provider. °· Do not drive or operate heavy machinery while taking prescription pain medicine. °Incision care ° °· Follow instructions from your health care provider about how to take care of the cut made during surgery (incision). Make sure you: °? Wash your hands with soap and water before you change your bandage (dressing). If soap and water are not available, use hand sanitizer. °? Change your dressing as told by your health care provider. °? Leave stitches (sutures), skin glue, or adhesive strips in place. These skin closures may need to be in place for 2 weeks or longer. If adhesive strip edges start to loosen and curl up, you may trim the loose edges. Do not remove adhesive strips completely unless your health care provider tells you to do that. °· Check your incision area every day for signs of infection. Check for: °? More redness, swelling, or pain. °? More fluid or blood. °? Warmth. °? Pus or a bad smell. °General instructions °· Raise (elevate) your legs above the level of your heart while you are sitting or lying down. °· Do any exercises your health care providers have given you. These may include deep breathing, coughing, and walking exercises. °· Do not shower, take baths, swim, or use a hot tub  unless told by your health care provider. °· Wear your elastic stocking if told by your health care provider. °· Keep all follow-up visits as told by your health care provider. This is important. °Contact a health care provider if: °· Medicine does not help your pain. °· Your pain gets worse. °· You have new leg bruises or your leg bruises get bigger. °· You have a fever. °· Your leg feels numb. °· You have more redness, swelling, or pain around your incision. °· You have more fluid or blood coming from your incision. °· Your incision feels warm to the touch. °· You have pus or a bad smell coming from your incision. °Get help right away if: °· Your pain is severe. °· You develop pain, tenderness, warmth, redness, or swelling in any part of your leg. °· You have chest pain. °· You have trouble breathing. °This information is not intended to replace advice given to you by your health care provider. Make sure you discuss any questions you have with your health care provider. °Document Released: 09/16/2010 Document Revised: 06/12/2015 Document Reviewed: 11/18/2014 °Elsevier Interactive Patient Education © 2018 [employer] °Coronary Artery Bypass Grafting, Care After °These instructions give you information on caring for yourself after your procedure. Your doctor may also give you more specific instructions. Call your doctor if you have any problems or questions after your procedure. °Follow these instructions at home: °· Only take medicine as told by your doctor. Take medicines exactly as told. Do not stop taking medicines or start any new medicines without talking to your doctor first. °·   Take your pulse as told by your doctor. °· Do deep breathing as told by your doctor. Use your breathing device (incentive spirometer), if given, to practice deep breathing several times a day. Support your chest with a pillow or your arms when you take deep breaths or cough. °· Keep the area clean, dry, and protected where the  surgery cuts (incisions) were made. Remove bandages (dressings) only as told by your doctor. If strips were applied to surgical area, do not take them off. They fall off on their own. °· Check the surgery area daily for puffiness (swelling), redness, or leaking fluid. °· If surgery cuts were made in your legs: °? Avoid crossing your legs. °? Avoid sitting for long periods of time. Change positions every 30 minutes. °? Raise your legs when you are sitting. Place them on pillows. °· Wear stockings that help keep blood clots from forming in your legs (compression stockings). °· Only take sponge baths until your doctor says it is okay to take showers. Pat the surgery area dry. Do not rub the surgery area with a washcloth or towel. Do not bathe, swim, or use a hot tub until your doctor says it is okay. °· Eat foods that are high in fiber. These include raw fruits and vegetables, whole grains, beans, and nuts. Choose lean meats. Avoid canned, processed, and fried foods. °· Drink enough fluids to keep your pee (urine) clear or pale yellow. °· Weigh yourself every day. °· Rest and limit activity as told by your doctor. You may be told to: °? Stop any activity if you have chest pain, shortness of breath, changes in heartbeat, or dizziness. Get help right away if this happens. °? Move around often for short amounts of time or take short walks as told by your doctor. Gradually become more active. You may need help to strengthen your muscles and build endurance. °? Avoid lifting, pushing, or pulling anything heavier than 10 pounds (4.5 kg) for at least 6 weeks after surgery. °· Do not drive until your doctor says it is okay. °· Ask your doctor when you can go back to work. °· Ask your doctor when you can begin sexual activity again. °· Follow up with your doctor as told. °Contact a doctor if: °· You have puffiness, redness, more pain, or fluid draining from the incision site. °· You have a fever. °· You have puffiness in your  ankles or legs. °· You have pain in your legs. °· You gain 2 or more pounds (0.9 kg) a day. °· You feel sick to your stomach (nauseous) or throw up (vomit). °· You have watery poop (diarrhea). °Get help right away if: °· You have chest pain that goes to your jaw or arms. °· You have shortness of breath. °· You have a fast or irregular heartbeat. °· You notice a "clicking" in your breastbone when you move. °· You have numbness or weakness in your arms or legs. °· You feel dizzy or light-headed. °This information is not intended to replace advice given to you by your health care provider. Make sure you discuss any questions you have with your health care provider. °Document Released: 01/09/2013 Document Revised: 06/12/2015 Document Reviewed: 06/13/2012 °Elsevier Interactive Patient Education © 2017 [employer] ° °

## 2017-05-27 NOTE — Discharge Summary (Signed)
Physician Discharge Summary  Patient ID: Tyrone Sherman MRN: [id number] DOB/AGE: [birth year] 67 y.o.  Admit date: 05/20/2017 Discharge date: 05/28/2017  Admission Diagnoses: Severe multivessel coronary artery disease  Discharge Diagnoses:  Active Problems:   Abnormal stress test   Angina pectoris (HCC)   Coronary artery disease due to lipid rich plaque   Essential hypertension   S/P CABG x 6  Patient Active Problem List   Diagnosis Date Noted  . S/P CABG x 6 05/24/2017  . Angina pectoris (HCC)   . Coronary artery disease due to lipid rich plaque   . Essential hypertension   . Abnormal stress test 05/20/2017  . Hypertension, benign essential, goal below 140/90   . Hyperlipidemia   . Obstructive sleep apnea on CPAP   . Allergic rhinitis   . Trigger thumb of right hand   . DJD (degenerative joint disease)   . GERD (gastroesophageal reflux disease)   . Ventral hernia     History of present illness:  Patient is a 67 year old male who presented with symptoms of exertional and resting chest pain.  He described the pain as a pressure-like feeling in the center of his chest without radiation and without associated symptoms of nausea or neck and arm pain.  The pain would normally resolve with rest.  He has multiple cardiac risk factors including hyperlipidemia, hypertension, obstructive sleep apnea as well as family history.  He underwent a stress test which demonstrated ST segment changes diffusely and also had an associated drop of systolic blood pressure.  He subsequently underwent cardiac catheterization and was found to have severe three-vessel coronary artery disease.  Cardiothoracic surgical consultation was obtained with Dr. Tyrone Sage who evaluated the patient and his studies and agree with recommendations to proceed with coronary artery surgical revascularization.   Discharged Condition: good  Hospital Course: Patient was admitted as described and underwent cardiac catheterization.   Due to the findings noted he was referred for cardiothoracic surgical consultation and subsequently surgery was planned.  On 05/24/2017 he was taken to the operating room where he underwent the below described procedure.  He tolerated it well and was taken to the surgical intensive care unit in stable condition.  Postoperative hospital course:  The patient is done quite well.  He was weaned from the ventilator using standard protocols without difficulty.  All routine lines, monitors and drainage devices have been discontinued in standard fashion.  He has maintained stable hemodynamics and normal sinus rhythm.  He does have a mild expected acute blood loss anemia which is stable.  Renal function has been in the normal range and he does have some postoperative volume overload but is responding well to diuretics.  Incisions are noted to be healing well without evidence of infection.  He is tolerating advancing activities using standard cardiac rehab protocols.  He is tolerating diet and has had a bowel movement.  Oxygen has been weaned and he maintains good saturations on room air.  His wounds are clean and dry. There are no signs of infection. As discussed with Dr. Donata Clay, he is surgically stable for discharge today.  Consults: cardiology  Significant Diagnostic Studies: angiography: Cardiac catheterization  Treatments: surgery:   OPERATIVE REPORT  DATE OF PROCEDURE:  05/24/2017  PREOPERATIVE DIAGNOSIS:  Severe 3-vessel coronary artery disease with new onset of angina.  POSTOPERATIVE DIAGNOSIS:  Severe 3-vessel coronary artery disease with new onset of angina.  SURGICAL PROCEDURE:  Coronary artery bypass grafting x6 with the left internal mammary to the  left anterior descending coronary artery, sequential reverse saphenous vein graft to the acute margin and posterior descending branches of the right coronary  artery, sequential reverse saphenous vein graft to the first and second obtuse  marginal, reverse saphenous vein graft to the diagonal, reverse with right thigh and calf greater saphenous vein harvesting endoscopically.  SURGEON:  Sheliah Plane, MD  FIRST ASSISTANT:  Lowella Dandy, PA   Discharge Exam: Blood pressure 137/82, pulse 86, temperature 98 F (36.7 C), temperature source Oral, resp. rate (!) 21, height  (1.778 m), weight 210 lb 4.8 oz (95.4 kg), SpO2 94 %. Cardiovascular: RRR Pulmonary: Slightly diminished at bases Abdomen: Soft, non tender, bowel sounds present. Extremities: Mild bilateral lower extremity edema. Wounds: Clean and dry.  No erythema or signs of infection.   Disposition: Discharge disposition: 01-Home or Self Care       Discharge Instructions    Amb Referral to Cardiac Rehabilitation   Complete by:  As directed    To [place] [facility]   Diagnosis:  CABG   CABG X ___:  6     Allergies as of 05/28/2017      Reactions   Lipitor [atorvastatin Calcium]    UNSPECIFIED REACTION    Norvasc [amlodipine Besylate]    UNSPECIFIED REACTION    Prinivil [lisinopril]    UNSPECIFIED REACTION       Medication List    STOP taking these medications   losartan-hydrochlorothiazide 50-12.5 MG tablet Commonly known as:  HYZAAR     TAKE these medications   acetaminophen 325 MG tablet Commonly known as:  TYLENOL Take 2 tablets (650 mg total) by mouth every 6 (six) hours as needed for mild pain.   ALPRAZolam 0.25 MG tablet Commonly known as:  XANAX Take 0.25 mg by mouth at bedtime as needed for anxiety.   aspirin 325 MG EC tablet Take 1 tablet (325 mg total) by mouth daily. What changed:    medication strength  how much to take   citalopram 40 MG tablet Commonly known as:  CELEXA Take 40 mg by mouth daily.   folic acid 1 MG tablet Commonly known as:  FOLVITE Take 1 mg by mouth daily.   furosemide 40 MG tablet Commonly known as:  LASIX Take 1 tablet (40 mg total) by mouth daily. For 5 days then stop.   Garlic 500 MG  Caps Take 500 mg by mouth.   metoprolol tartrate 25 MG tablet Commonly known as:  LOPRESSOR Take 0.5 tablets (12.5 mg total) by mouth 2 (two) times daily.   Omega 3 1200 MG Caps Take 1,200 mg by mouth daily.   omeprazole 20 MG capsule Commonly known as:  PRILOSEC Take 20 mg by mouth 2 (two) times daily before a meal.   potassium chloride 10 MEQ tablet Commonly known as:  K-DUR Take 1 tablet (10 mEq total) by mouth daily. For 5 days then stop.   rosuvastatin 40 MG tablet Commonly known as:  CRESTOR Take 1 tablet (40 mg total) by mouth daily. What changed:    medication strength  how much to take   traMADol 50 MG tablet Commonly known as:  ULTRAM Take 50 mg by mouth every 4-6 hours PRN moderate to severe pain   vitamin C 100 MG tablet Take 100 mg by mouth daily.   Vitamin D3 2000 units Tabs Take 2,000 Units by mouth daily.   vitamin E 100 UNIT capsule Take 100 Units by mouth daily.     The patient  has been discharged on:   1.Beta Blocker:  Yes [ y  ]                              No   [   ]                              If No, reason:  2.Ace Inhibitor/ARB: Yes [   ]                                     No  [ n   ]                                     If No, reason:labile BP  3.Statin:   Yes [place].Etienne   ]                  No  [   ]                  If No, reason:  4.Ecasa:  Yes  [ y  ]                  No   [   ]                  If No, reason:  Follow-up Information    Delight Ovens, MD. Go on 06/27/2017.   Specialty:  Cardiothoracic Surgery Why:  Please see discharge paperwork for a 4-week follow-up with Dr. Dennie Maizes physician assistant.  Obtain a PA/LAT chest x-ray at [facility] imaging 1/2-hour prior to this appointment.  It is located on the first floor of the same office complex. Contact information: [address] [employer] [address] [place] [address] [phone]        Allayne Butcher, PA-C. Go on 06/15/2017.   Specialties:  Cardiology,  Radiology Why:  Appointment time is at 11:30 am Contact information: [address] [place] [address] [phone]            Signed: Elenore Rota 05/28/2017, 11:53 AM

## 2017-05-28 LAB — GLUCOSE, CAPILLARY: Glucose-Capillary: 95 mg/dL (ref 65–99)

## 2017-05-28 MED ORDER — ASPIRIN 325 MG PO TBEC: 325.0000 mg | DELAYED_RELEASE_TABLET | Freq: Every day | ORAL | 0 refills | Status: DC

## 2017-05-28 MED ORDER — METOPROLOL TARTRATE 25 MG PO TABS: 12.5000 mg | ORAL_TABLET | Freq: Two times a day (BID) | ORAL | 1 refills | Status: DC

## 2017-05-28 MED ORDER — FUROSEMIDE 40 MG PO TABS: 40.0000 mg | ORAL_TABLET | Freq: Every day | ORAL | 0 refills | Status: DC

## 2017-05-28 MED ORDER — TRAMADOL HCL 50 MG PO TABS: ORAL_TABLET | ORAL | 0 refills | Status: DC

## 2017-05-28 MED ORDER — ACETAMINOPHEN 325 MG PO TABS: 650.0000 mg | ORAL_TABLET | Freq: Four times a day (QID) | ORAL | Status: DC | PRN

## 2017-05-28 MED ORDER — ROSUVASTATIN CALCIUM 40 MG PO TABS: 40.0000 mg | ORAL_TABLET | Freq: Every day | ORAL | 1 refills | Status: DC

## 2017-05-28 MED ORDER — POTASSIUM CHLORIDE ER 10 MEQ PO TBCR: 10.0000 meq | EXTENDED_RELEASE_TABLET | Freq: Every day | ORAL | 0 refills | Status: DC

## 2017-05-28 NOTE — Progress Notes (Signed)
      [address]       [place] [address]             [phone]        4 Days Post-Op Procedure(s) (LRB): CORONARY ARTERY BYPASS GRAFTING (CABG) (N/A) TRANSESOPHAGEAL ECHOCARDIOGRAM (TEE) (N/A)  Subjective: Patient sitting in chair. He has no complaints this am. He wants to go home.  Objective: Vital signs in last 24 hours: Temp:  [98 F (36.7 C)-99.8 F (37.7 C)] 98 F (36.7 C) (05/11 0501) Pulse Rate:  [77-95] 77 (05/11 0501) Cardiac Rhythm: Normal sinus rhythm (05/10 2116) Resp:  [21-32] 21 (05/11 0501) BP: (125-143)/(76-85) 137/82 (05/11 0501) SpO2:  [93 %-100 %] 94 % (05/11 0023) Weight:  [210 lb 4.8 oz (95.4 kg)] 210 lb 4.8 oz (95.4 kg) (05/11 0501)  Pre op weight 92 kg Current Weight  05/28/17 210 lb 4.8 oz (95.4 kg)      Intake/Output from previous day: 05/10 0701 - 05/11 0700 In: 603 [P.O.:600; I.V.:3] Out: -    Physical Exam:  Cardiovascular: RRR Pulmonary: Slightly diminished at bases Abdomen: Soft, non tender, bowel sounds present. Extremities: Mild bilateral lower extremity edema. Wounds: Clean and dry.  No erythema or signs of infection.  Lab Results: CBC: Recent Labs    05/26/17 0509 05/27/17 0426  WBC 13.1* 11.3*  HGB 10.6* 10.5*  HCT 34.1* 32.2*  PLT 141* 166   BMET:  Recent Labs    05/26/17 0509 05/27/17 0426  NA 137 135  K 4.6 4.2  CL 104 100*  CO2 29 27  GLUCOSE 116* 110*  BUN 12 16  CREATININE 0.61 0.72  CALCIUM 8.1* 8.1*    PT/INR:  Lab Results  Component Value Date   INR 1.31 05/24/2017   INR 1.01 05/21/2017   ABG:  INR: Will add last result for INR, ABG once components are confirmed Will add last 4 CBG results once components are confirmed  Assessment/Plan:  1. CV - SR. On Lopressor 12.5 mg bid. 2.  Pulmonary - On room air. Encourage incentive spirometer. 3. Volume Overload - On Lasix 40 mg daily. Will continue for 5 more days as still above pre op weight. 4.  Acute blood loss  anemia - Last H and H stable at 10.5 and 32.2 5. Discharge  Jakeia Carreras M ZimmermanPA-C 05/28/2017,7:17 AM

## 2017-05-28 NOTE — Progress Notes (Signed)
05/28/2017 12:51 PM Discharge AVS meds taken today and those due this evening reviewed.  Follow-up appointments and when to call md reviewed.  D/C IV and TELE.  Questions and concerns addressed.   D/C home per orders. Kathryne Hitch

## 2017-06-15 ENCOUNTER — Ambulatory Visit: Payer: BLUE CROSS/BLUE SHIELD | Admitting: Cardiology

## 2017-06-15 ENCOUNTER — Encounter: Payer: Self-pay | Admitting: Cardiology

## 2017-06-15 VITALS — BP 126/72 | HR 73 | Ht 70.0 in | Wt 207.0 lb

## 2017-06-15 DIAGNOSIS — E785 Hyperlipidemia, unspecified: Secondary | ICD-10-CM | POA: Diagnosis not present

## 2017-06-15 NOTE — Progress Notes (Signed)
06/15/2017 Tyrone Sherman   [birth year]  [id number]  Primary Physician Marden Noble, MD Primary Cardiologist: Dr. Mayford Knife  Reason for Visit/CC: Hospital follow-up for CAD status post CABG  HPI:  Tyrone Sherman is a 67 y.o. male who is being seen today for post hospital follow-up after management for CAD.  He has a strong family history of coronary artery disease in first degree relatives.  His mother had 8 vessel coronary artery bypass surgery.  Father had CABG x7.  His brother has also had bypass surgery.  Patient also has a personal history of hyperlipidemia and hypertension.  He was recently evaluated by his PCP, Dr. Marden Noble, for chest pain concerning for cardiac etiology.  This led to an exercise tolerance test which was abnormal suggestive of coronary ischemia.  He was subsequently referred to cardiology for further evaluation and was seen by Dr. Armanda Magic on May 17, 2017.  She recommended definitive cardiac catheterization.  This was performed by Dr. Eldridge Dace on May 20, 2017 and he was noted to have severe multivessel disease (see complete angiographic details outlined below in cath report).  Left ventricular ejection fraction was preserved at 55 to 65%.  No significant valvular abnormalities.  CABG was recommended and cardiothoracic surgery was consulted.  He underwent successful CABG x6 by Dr. Tyrone Sage on May 24, 2017 (LIMA to the LAD, sequential reverse saphenous vein graft to the acute marginal and posterior descending branches of the RCA, sequential reverse saphenous vein graft to the first and second obtuse marginal, reverse saphenous vein graft to the diagonal).  He tolerated surgery well and postoperative course was fairly unremarkable.  He did not have any postoperative arrhythmias.  He required Lasix temporarily postop for hypervolemia.  He was placed on high-dose aspirin therapy, 325 mg daily along with high-dose statin therapy, Crestor 40 mg.  His LDL was elevated at 133 mg/dL.   Hgb A1c was within normal limits at 5.6. Metoprolol also added to regimen.   He presents to clinic today for follow-up.  He is here with his wife.  He looks great.  He reports that he has done well since discharge.  He denies any further anginal symptomatology.  No chest pain or dyspnea.  He is euvolemic on physical exam.  No issues with lower extremity edema.  His sternal incision and graft harvesting sites on his right lower extremity are well-healed without signs of infection.  Blood pressure is well controlled at 126/72.  Heart rate in the low 70s.  He reports full medication compliance.  He is interested in starting cardiac rehab.  Current Meds  Medication Sig  . acetaminophen (TYLENOL) 325 MG tablet Take 2 tablets (650 mg total) by mouth every 6 (six) hours as needed for mild pain.  Marland Kitchen ALPRAZolam (XANAX) 0.25 MG tablet Take 0.25 mg by mouth at bedtime as needed for anxiety.  . Ascorbic Acid (VITAMIN C) 100 MG tablet Take 100 mg by mouth daily.  Marland Kitchen aspirin EC 325 MG EC tablet Take 1 tablet (325 mg total) by mouth daily.  . Cholecalciferol (VITAMIN D3) 2000 units TABS Take 2,000 Units by mouth daily.  . citalopram (CELEXA) 40 MG tablet Take 40 mg by mouth daily.  . folic acid (FOLVITE) 1 MG tablet Take 1 mg by mouth daily.  . Garlic 500 MG CAPS Take 500 mg by mouth.  . metoprolol tartrate (LOPRESSOR) 25 MG tablet Take 0.5 tablets (12.5 mg total) by mouth 2 (two) times daily.  . nitroGLYCERIN (NITROSTAT)  0.4 MG SL tablet 1 tablet sublingually Every 5 minutes as needed for chest pain not relieved with rest 30 days  . Omega 3 1200 MG CAPS Take 1,200 mg by mouth daily.  Marland Kitchen omeprazole (PRILOSEC) 20 MG capsule Take 20 mg by mouth 2 (two) times daily before a meal.  . rosuvastatin (CRESTOR) 40 MG tablet Take 1 tablet (40 mg total) by mouth daily.  . vitamin E 100 UNIT capsule Take 100 Units by mouth daily.    Allergies  Allergen Reactions  . Lipitor [Atorvastatin Calcium]     UNSPECIFIED REACTION     . Norvasc [Amlodipine Besylate]     UNSPECIFIED REACTION   . Prinivil [Lisinopril]     UNSPECIFIED REACTION    Past Medical History:  Diagnosis Date  . Allergic rhinitis   . Benign essential hypertension   . Chest tightness    EXERTIONAL  . DJD (degenerative joint disease)   . GERD (gastroesophageal reflux disease)   . Hyperlipidemia   . Hypertension   . Metatarsal fracture 2012   LEFT FOOT  . Obstructive sleep apnea on CPAP   . Onychomycosis   . Trigger thumb of right hand   . Ventral hernia   . Ventral hernia    Family History  Problem Relation Age of Onset  . CAD Mother   . Other Mother        CABG x8 VESSELS  . CAD Father   . Other Father        7 VESSEL BYPASS   . Hypertension Brother 62  . CAD Brother   . Hypertension Brother 54  . CAD Brother   . Hypertension Brother 20  . CAD Brother    Past Surgical History:  Procedure Laterality Date  . CORONARY ARTERY BYPASS GRAFT N/A 05/24/2017   Procedure: CORONARY ARTERY BYPASS GRAFTING (CABG);  Surgeon: Delight Ovens, MD;  Location: [facility] OR;  Service: Open Heart Surgery;  Laterality: N/A;  Times 6 using left internal mammary artery to LAD and endoscopically harvested right saphenous vein to Diagonal, OM1, OM2, Acute Marginal, and PDA  . Incarcerated umbilical hernia repair    . LEFT HEART CATH AND CORONARY ANGIOGRAPHY N/A 05/20/2017   Procedure: LEFT HEART CATH AND CORONARY ANGIOGRAPHY;  Surgeon: Corky Crafts, MD;  Location: [facility] INVASIVE CV LAB;  Service: Cardiovascular;  Laterality: N/A;  . Left metatarsal fracture repair    . Left varicocele repair 1973    . TEE WITHOUT CARDIOVERSION N/A 05/24/2017   Procedure: TRANSESOPHAGEAL ECHOCARDIOGRAM (TEE);  Surgeon: Delight Ovens, MD;  Location: [facility] OR;  Service: Open Heart Surgery;  Laterality: N/A;   Social History   Socioeconomic History  . Marital status: Married    Spouse name: Not on file  . Number of children: Not on file  . Years of education: Not on  file  . Highest education level: Not on file  Occupational History  . Occupation: Loss adjuster, chartered  . Financial resource strain: Not on file  . Food insecurity:    Worry: Not on file    Inability: Not on file  . Transportation needs:    Medical: Not on file    Non-medical: Not on file  Tobacco Use  . Smoking status: Never Smoker  . Smokeless tobacco: Never Used  Substance and Sexual Activity  . Alcohol use: Never    Frequency: Never  . Drug use: Never  . Sexual activity: Yes  Lifestyle  . Physical activity:  Days per week: Not on file    Minutes per session: Not on file  . Stress: Not on file  Relationships  . Social connections:    Talks on phone: Not on file    Gets together: Not on file    Attends religious service: Not on file    Active member of club or organization: Not on file    Attends meetings of clubs or organizations: Not on file    Relationship status: Not on file  . Intimate partner violence:    Fear of current or ex partner: Not on file    Emotionally abused: Not on file    Physically abused: Not on file    Forced sexual activity: Not on file  Other Topics Concern  . Not on file  Social History Narrative  . Not on file     Review of Systems: General: negative for chills, fever, night sweats or weight changes.  Cardiovascular: negative for chest pain, dyspnea on exertion, edema, orthopnea, palpitations, paroxysmal nocturnal dyspnea or shortness of breath Dermatological: negative for rash Respiratory: negative for cough or wheezing Urologic: negative for hematuria Abdominal: negative for nausea, vomiting, diarrhea, bright red blood per rectum, melena, or hematemesis Neurologic: negative for visual changes, syncope, or dizziness All other systems reviewed and are otherwise negative except as noted above.   Physical Exam:  Blood pressure 126/72, pulse 73, height  (1.778 m), weight 207 lb (93.9 kg), SpO2 97 %.  General  appearance: alert, cooperative and no distress Neck: no carotid bruit and no JVD Lungs: clear to auscultation bilaterally Heart: regular rate and rhythm, S1, S2 normal, no murmur, click, rub or gallop Extremities: extremities normal, atraumatic, no cyanosis or edema Pulses: 2+ and symmetric Skin: Skin color, texture, turgor normal. No rashes or lesions Neurologic: Grossly normal  EKG not performed -- personally reviewed   ASSESSMENT AND PLAN:   1. CAD: Native multivessel disease, treated with recent CABG x6 May 25, 2007 per Dr. Tyrone Sage with left internal mammary to the left anterior descending coronary artery, sequential reverse saphenous vein graft to the acute margin and posterior descending branches of the right coronary artery, sequential reverse saphenous vein graft to the first and second obtuse marginal, reverse saphenous vein graft to the diagonal. EF normal.  Stable without anginal symptoms.  Continue medical therapy with aspirin, high intensity statin and beta-blocker. Plan to start cardiac rehab, once cleared by Dr. Tyrone Sage.   2.  Hyperlipidemia: Recent lipid panel showed elevated LDL at 133 mg/dL.  His Crestor was increased from 20 to 40 mg nightly.  He reports full compliance and is tolerating this well without any side effects.  Plan to repeat fasting lipid panel and hepatic function test again in 6 more weeks.  Goal LDL in the setting of multivessel disease is less than 70 mg/dL.  If he is not at goal in 6 weeks, we can add Zetia to his regimen.  3. Hypertension: Controlled on current regimen.  4. OSA: reports full compliance with PCP.   Follow-Up w/ Dr. Mayford Knife in 3 months   Najee Manninen Delmer Islam, MHS [facility] 06/15/2017 11:57 AM

## 2017-06-15 NOTE — Patient Instructions (Signed)
Medication Instructions:   Your physician recommends that you continue on your current medications as directed. Please refer to the Current Medication list given to you today.   If you need a refill on your cardiac medications before your next appointment, please call your pharmacy.  Labwork:  RETURN IN 6 WEEKS  FOR FASTING LIPIDS AND LIVER    Testing/Procedures:  NONE ORDERED  TODAY    Follow-Up:   IN 3 MONTHS WITH DR TURNER    Any Other Special Instructions Will Be Listed Below (If Applicable).

## 2017-06-23 ENCOUNTER — Other Ambulatory Visit: Payer: Self-pay | Admitting: Cardiothoracic Surgery

## 2017-06-23 DIAGNOSIS — Z951 Presence of aortocoronary bypass graft: Secondary | ICD-10-CM

## 2017-06-27 ENCOUNTER — Ambulatory Visit
Admission: RE | Admit: 2017-06-27 | Discharge: 2017-06-27 | Disposition: A | Discharge status: Home / Self Care | Payer: BLUE CROSS/BLUE SHIELD | Source: Ambulatory Visit | Attending: Cardiothoracic Surgery | Admitting: Cardiothoracic Surgery

## 2017-06-27 ENCOUNTER — Ambulatory Visit (INDEPENDENT_AMBULATORY_CARE_PROVIDER_SITE_OTHER): Payer: Self-pay | Admitting: Surgical

## 2017-06-27 ENCOUNTER — Ambulatory Visit: Payer: Self-pay

## 2017-06-27 ENCOUNTER — Encounter: Payer: Self-pay | Admitting: Surgical

## 2017-06-27 VITALS — BP 138/77 | HR 76 | Resp 20 | Ht 70.0 in | Wt 212.0 lb

## 2017-06-27 DIAGNOSIS — Z951 Presence of aortocoronary bypass graft: Secondary | ICD-10-CM

## 2017-06-27 DIAGNOSIS — I2510 Atherosclerotic heart disease of native coronary artery without angina pectoris: Secondary | ICD-10-CM

## 2017-06-27 MED ORDER — HYDROCHLOROTHIAZIDE 12.5 MG PO CAPS: 12.5000 mg | ORAL_CAPSULE | Freq: Every day | ORAL | 1 refills | Status: DC

## 2017-06-27 NOTE — Progress Notes (Signed)
[address]       [place] [address]             [phone]      Tyrone Sherman [facility] Medical Record #[id number] Date of Birth: Nov 03, 1950  Referring: Quintella Reichert, MD Primary Care: Marden Noble, MD Primary Cardiologist: Armanda Magic, MD   Chief Complaint:   POST OP FOLLOW UP  OPERATIVE REPORT  DATE OF PROCEDURE:  05/24/2017  PREOPERATIVE DIAGNOSIS:  Severe 3-vessel coronary artery disease with new onset of angina.  POSTOPERATIVE DIAGNOSIS:  Severe 3-vessel coronary artery disease with new onset of angina.  SURGICAL PROCEDURE:  Coronary artery bypass grafting x6 with the left internal mammary to the left anterior descending coronary artery, sequential reverse saphenous vein graft to the acute margin and posterior descending branches of the right coronary  artery, sequential reverse saphenous vein graft to the first and second obtuse marginal, reverse saphenous vein graft to the diagonal,  with right thigh and calf greater saphenous vein harvesting endoscopically.  SURGEON:  Sheliah Plane, MD  FIRST ASSISTANT:  Lowella Dandy, PA   History of Present Illness:    The patient is a 67 year old male status post the above described procedure seen in the office on today's date and routine postsurgical follow-up.  Overall, the patient feels as though he is continued to make excellent progress.  He does note some lower extremity edema.  He denies chest pain or shortness of breath.  He does have some sternal incision "soreness" but is no longer taking any pain medication.  He denies palpitations.  He has had no difficulties with the incisions.  He denies fevers, chills or other constitutional symptoms.  He has been seen by cardiology and they are pleased with his overall progress as well.  He is anxious to begin the cardiac rehabilitation phase 2 program.      Past Medical History:  Diagnosis Date  . Allergic rhinitis   . Benign essential  hypertension   . Chest tightness    EXERTIONAL  . DJD (degenerative joint disease)   . GERD (gastroesophageal reflux disease)   . Hyperlipidemia   . Hypertension   . Metatarsal fracture 2012   LEFT FOOT  . Obstructive sleep apnea on CPAP   . Onychomycosis   . Trigger thumb of right hand   . Ventral hernia   . Ventral hernia      Social History   Tobacco Use  Smoking Status Never Smoker  Smokeless Tobacco Never Used    Social History   Substance and Sexual Activity  Alcohol Use Never  . Frequency: Never     Allergies  Allergen Reactions  . Lipitor [Atorvastatin Calcium]     UNSPECIFIED REACTION   . Norvasc [Amlodipine Besylate]     UNSPECIFIED REACTION   . Prinivil [Lisinopril]     UNSPECIFIED REACTION     Current Outpatient Medications  Medication Sig Dispense Refill  . acetaminophen (TYLENOL) 325 MG tablet Take 2 tablets (650 mg total) by mouth every 6 (six) hours as needed for mild pain.    Marland Kitchen ALPRAZolam (XANAX) 0.25 MG tablet Take 0.25 mg by mouth at bedtime as needed for anxiety.    . Ascorbic Acid (VITAMIN C) 100 MG tablet Take 100 mg by mouth daily.    Marland Kitchen aspirin EC 325 MG EC tablet Take 1 tablet (325 mg total) by mouth daily. 30 tablet 0  . Cholecalciferol (VITAMIN D3) 2000 units TABS Take  2,000 Units by mouth daily.    . citalopram (CELEXA) 40 MG tablet Take 40 mg by mouth daily.    . folic acid (FOLVITE) 1 MG tablet Take 1 mg by mouth daily.    . Garlic 500 MG CAPS Take 500 mg by mouth.    . metoprolol tartrate (LOPRESSOR) 25 MG tablet Take 0.5 tablets (12.5 mg total) by mouth 2 (two) times daily. 30 tablet 1  . nitroGLYCERIN (NITROSTAT) 0.4 MG SL tablet 1 tablet sublingually Every 5 minutes as needed for chest pain not relieved with rest 30 days  3  . Omega 3 1200 MG CAPS Take 1,200 mg by mouth daily.    Marland Kitchen. omeprazole (PRILOSEC) 20 MG capsule Take 20 mg by mouth 2 (two) times daily before a meal.    . rosuvastatin (CRESTOR) 40 MG tablet Take 1 tablet (40  mg total) by mouth daily. 30 tablet 1  . vitamin E 100 UNIT capsule Take 100 Units by mouth daily.      No current facility-administered medications for this visit.        Physical Exam: There were no vitals taken for this visit.  General appearance: alert, cooperative and no distress Heart: regular rate and rhythm Lungs: clear to auscultation bilaterally Abdomen: benign exam Extremities: Minor lower extremity edema Wound: Incisions noted to be healing well without evidence of infection   Diagnostic Studies & Laboratory data:     Recent Radiology Findings:   Dg Chest 2 View  Result Date: 06/27/2017 CLINICAL DATA:  History of CABG, hypertension, no current complaints EXAM: CHEST - 2 VIEW COMPARISON:  05/27/2017 FINDINGS: There is no focal parenchymal opacity. There is no pleural effusion or pneumothorax. The heart and mediastinal contours are unremarkable. There is evidence of prior CABG. The osseous structures are unremarkable. IMPRESSION: No active cardiopulmonary disease. Electronically Signed   By: Elige KoHetal  Patel   On: 06/27/2017 14:45      Recent Lab Findings: Lab Results  Component Value Date   WBC 11.3 (H) 05/27/2017   HGB 10.5 (L) 05/27/2017   HCT 32.2 (L) 05/27/2017   PLT 166 05/27/2017   GLUCOSE 110 (H) 05/27/2017   CHOL 192 05/21/2017   TRIG 86 05/21/2017   HDL 42 05/21/2017   LDLCALC 133 (H) 05/21/2017   ALT 30 05/21/2017   AST 21 05/21/2017   NA 135 05/27/2017   K 4.2 05/27/2017   CL 100 (L) 05/27/2017   CREATININE 0.72 05/27/2017   BUN 16 05/27/2017   CO2 27 05/27/2017   TSH 1.510 05/21/2017   INR 1.31 05/24/2017   HGBA1C 5.6 05/23/2017      Assessment / Plan: The patient is doing quite well.  There are currently no surgically related concerns.  His blood pressure is a little elevated he notes when checking at home.  Due to this and some lower extremity edema I will resume his hydrochlorothiazide at this time.  He was previously taking this as a  combination pill with an ARB however I do not feel he needs the ARB component at this time.  I will not make any other changes to his current medication regimen.  We had a discussion of lifestyle and nutrition as long-term prevention and he is determined to make some of these changes.  As there are currently no surgically related issues we will see him again on a as needed basis at request.         Rowe ClackWayne E Aivah Putman, PA-C 06/27/2017 2:48 PM

## 2017-06-27 NOTE — Patient Instructions (Signed)
Discussed activity progression as well as driving protocols.

## 2017-06-28 ENCOUNTER — Other Ambulatory Visit: Payer: Self-pay

## 2017-06-28 ENCOUNTER — Ambulatory Visit: Payer: Self-pay

## 2017-06-28 ENCOUNTER — Other Ambulatory Visit: Payer: Self-pay | Admitting: Physician Assistant

## 2017-07-11 ENCOUNTER — Telehealth (HOSPITAL_COMMUNITY): Payer: Self-pay

## 2017-07-11 NOTE — Telephone Encounter (Signed)
Patients insurance is active and benefits verified through [place] - No co-pay, deductible amount of $500.00/$500.00 has been met, out of pocket amount of $6,000/$6,000 has been met, no co-insurance, and no pre-authorization is required. Reference #[id number]  Will contact patient to see if he is interested in the Cardiac Rehab. If interested, patient will be contacted for scheduling upon review by the RN Navigator.

## 2017-07-11 NOTE — Telephone Encounter (Signed)
Called patient to see if he is interested in the Cardiac Rehab Program. Patient stated he is interested in the program although he would like to Participate at [facility]. Faxed over information to [facility].

## 2017-07-20 ENCOUNTER — Other Ambulatory Visit: Payer: Self-pay | Admitting: Physician Assistant

## 2017-07-29 ENCOUNTER — Other Ambulatory Visit: Payer: BLUE CROSS/BLUE SHIELD

## 2017-07-29 DIAGNOSIS — E785 Hyperlipidemia, unspecified: Secondary | ICD-10-CM

## 2017-07-29 LAB — HEPATIC FUNCTION PANEL
ALT: 25 IU/L (ref 0–44)
AST: 17 IU/L (ref 0–40)
Albumin: 4.6 g/dL (ref 3.6–4.8)
Alkaline Phosphatase: 49 IU/L (ref 39–117)
BILIRUBIN TOTAL: 0.2 mg/dL (ref 0.0–1.2)
Bilirubin, Direct: 0.09 mg/dL (ref 0.00–0.40)
Total Protein: 6.8 g/dL (ref 6.0–8.5)

## 2017-07-29 LAB — LIPID PANEL
CHOL/HDL RATIO: 4.9 ratio (ref 0.0–5.0)
Cholesterol, Total: 163 mg/dL (ref 100–199)
HDL: 33 mg/dL — ABNORMAL LOW (ref >39)
LDL Calculated: 110 mg/dL — ABNORMAL HIGH (ref 0–99)
Triglycerides: 102 mg/dL (ref 0–149)
VLDL Cholesterol Cal: 20 mg/dL (ref 5–40)

## 2017-08-10 ENCOUNTER — Other Ambulatory Visit: Payer: Self-pay | Admitting: Physician Assistant

## 2017-08-15 ENCOUNTER — Encounter: Payer: Self-pay | Admitting: *Deleted

## 2017-08-16 ENCOUNTER — Telehealth: Payer: Self-pay | Admitting: Cardiology

## 2017-08-16 DIAGNOSIS — E785 Hyperlipidemia, unspecified: Secondary | ICD-10-CM

## 2017-08-16 MED ORDER — EZETIMIBE 10 MG PO TABS: 10.0000 mg | ORAL_TABLET | Freq: Every day | ORAL | 3 refills | Status: DC

## 2017-08-16 NOTE — Telephone Encounter (Signed)
New Message: ° ° ° ° ° °Pt is returning a call for lab results. °

## 2017-08-16 NOTE — Telephone Encounter (Signed)
Spoke with pt re: lab results. Pt states that he is compliant with his Crestor nightly and so we will add Zetia 10 mg daily and repeat Lipid / LFT 09/26/17 Pt thanked me for the call.

## 2017-08-23 ENCOUNTER — Encounter: Payer: Self-pay | Admitting: Cardiology

## 2017-08-23 ENCOUNTER — Encounter: Payer: Self-pay | Admitting: Surgical

## 2017-08-26 ENCOUNTER — Other Ambulatory Visit: Payer: Self-pay

## 2017-08-26 ENCOUNTER — Telehealth: Payer: Self-pay | Admitting: Cardiology

## 2017-08-26 MED ORDER — HYDROCHLOROTHIAZIDE 12.5 MG PO CAPS: 12.5000 mg | ORAL_CAPSULE | Freq: Every day | ORAL | 1 refills | Status: DC

## 2017-08-26 NOTE — Telephone Encounter (Signed)
Spoke with patient and informed him that he would need a BMET before our office could refill HCTZ and the patient declined and said he would wait till his appointment with Dr. Mayford Knifeurner. The patient requested HCTZ from [facility] after his surgery and he was fine to go two weeks without it, his edema before the medication was minimal. Advised the patient if symptoms worsen to call the office.

## 2017-09-15 NOTE — Progress Notes (Signed)
Cardiology Office Note:    Date:  09/18/2017   ID:  Tyrone FitchBenton C Newcombe, DOB 07/06/1950, MRN [id number]  PCP:  Marden NobleGates, Robert, MD  Cardiologist:  Armanda Magicraci Kamarii Buren, MD    Referring MD: Marden NobleGates, Robert, MD   Chief Complaint  Patient presents with  . Coronary Artery Disease  . Hypertension  . Hyperlipidemia    History of Present Illness:    Tyrone Sherman is a 67 y.o. male with a hx of ASCAD, HTN and hyperlipidemia. He presented for evaluation of CP and stress test c/w ischemia and patient underwent cath showing severe multivessel CAD with normal LVF and underwent CABG 05/24/2017 (LIMA to the LAD, sequential reverse saphenous vein graft to the acute marginal and posterior descending branches of the RCA, sequential reverse saphenous vein graft to the first and second obtuse marginal, reverse saphenous vein graft to the diagonal).  He was seen by extender  06/15/2017 and was doing well.    He is here today for followup and is doing well.  He denies any chest pain or pressure, SOB, DOE, PND, orthopnea, LE edema, dizziness, palpitations or syncope. He is compliant with his meds and is tolerating meds with no SE.    Past Medical History:  Diagnosis Date  . Allergic rhinitis   . Benign essential hypertension   . Chest tightness    EXERTIONAL  . DJD (degenerative joint disease)   . GERD (gastroesophageal reflux disease)   . Hyperlipidemia   . Hypertension   . Metatarsal fracture 2012   LEFT FOOT  . Obstructive sleep apnea on CPAP   . Onychomycosis   . Trigger thumb of right hand   . Ventral hernia   . Ventral hernia     Past Surgical History:  Procedure Laterality Date  . CORONARY ARTERY BYPASS GRAFT N/A 05/24/2017   Procedure: CORONARY ARTERY BYPASS GRAFTING (CABG);  Surgeon: Delight OvensGerhardt, Edward B, MD;  [facility];  Service: Open Heart Surgery;  Laterality: N/A;  Times 6 using left internal mammary artery to LAD and endoscopically harvested right saphenous vein to Diagonal, OM1, OM2, Acute  Marginal, and PDA  . Incarcerated umbilical hernia repair    . LEFT HEART CATH AND CORONARY ANGIOGRAPHY N/A 05/20/2017   Procedure: LEFT HEART CATH AND CORONARY ANGIOGRAPHY;  Surgeon: Corky CraftsVaranasi, Jayadeep S, MD;  [facility];  Service: Cardiovascular;  Laterality: N/A;  . Left metatarsal fracture repair    . Left varicocele repair 1973    . TEE WITHOUT CARDIOVERSION N/A 05/24/2017   Procedure: TRANSESOPHAGEAL ECHOCARDIOGRAM (TEE);  Surgeon: Delight OvensGerhardt, Edward B, MD;  [facility];  Service: Open Heart Surgery;  Laterality: N/A;    Current Medications: Current Meds  Medication Sig  . acetaminophen (TYLENOL) 325 MG tablet Take 2 tablets (650 mg total) by mouth every 6 (six) hours as needed for mild pain.  Marland Kitchen. ALPRAZolam (XANAX) 0.25 MG tablet Take 0.25 mg by mouth at bedtime as needed for anxiety.  . Ascorbic Acid (VITAMIN C) 100 MG tablet Take 100 mg by mouth daily.  Marland Kitchen. aspirin EC 81 MG tablet Take 81 mg by mouth daily.  . Cholecalciferol (VITAMIN D3) 2000 units TABS Take 2,000 Units by mouth daily.  . citalopram (CELEXA) 40 MG tablet Take 40 mg by mouth daily.  Marland Kitchen. ezetimibe (ZETIA) 10 MG tablet Take 1 tablet (10 mg total) by mouth daily.  . folic acid (FOLVITE) 1 MG tablet Take 1 mg by mouth daily.  . Garlic 500 MG CAPS Take  500 mg by mouth.  . hydrochlorothiazide (MICROZIDE) 12.5 MG capsule Take 1 capsule (12.5 mg total) by mouth daily.  . metoprolol tartrate (LOPRESSOR) 25 MG tablet Take 0.5 tablets (12.5 mg total) by mouth 2 (two) times daily.  . nitroGLYCERIN (NITROSTAT) 0.4 MG SL tablet 1 tablet sublingually Every 5 minutes as needed for chest pain not relieved with rest 30 days  . Omega 3 1200 MG CAPS Take 1,200 mg by mouth daily.  Marland Kitchen omeprazole (PRILOSEC) 20 MG capsule Take 20 mg by mouth 2 (two) times daily before a meal.  . rosuvastatin (CRESTOR) 40 MG tablet Take 1 tablet (40 mg total) by mouth daily.  . vitamin E 100 UNIT capsule Take 100 Units by mouth daily.   .  [DISCONTINUED] aspirin EC 325 MG EC tablet Take 1 tablet (325 mg total) by mouth daily. (Patient taking differently: Take 81 mg by mouth daily. )     Allergies:   Lipitor [atorvastatin calcium]; Norvasc [amlodipine besylate]; and Prinivil [lisinopril]   Social History   Socioeconomic History  . Marital status: Married    Spouse name: Not on file  . Number of children: Not on file  . Years of education: Not on file  . Highest education level: Not on file  Occupational History  . Occupation: Loss adjuster, chartered  . Financial resource strain: Not on file  . Food insecurity:    Worry: Not on file    Inability: Not on file  . Transportation needs:    Medical: Not on file    Non-medical: Not on file  Tobacco Use  . Smoking status: Never Smoker  . Smokeless tobacco: Never Used  Substance and Sexual Activity  . Alcohol use: Never    Frequency: Never  . Drug use: Never  . Sexual activity: Yes  Lifestyle  . Physical activity:    Days per week: Not on file    Minutes per session: Not on file  . Stress: Not on file  Relationships  . Social connections:    Talks on phone: Not on file    Gets together: Not on file    Attends religious service: Not on file    Active member of club or organization: Not on file    Attends meetings of clubs or organizations: Not on file    Relationship status: Not on file  Other Topics Concern  . Not on file  Social History Narrative  . Not on file     Family History: The patient's family history includes CAD in his brother, brother, brother, father, and mother; Hypertension (age of onset: 77) in his brother; Hypertension (age of onset: 69) in his brother; Hypertension (age of onset: 72) in his brother; Other in his father and mother.  ROS:   Please see the history of present illness.    ROS  All other systems reviewed and negative.   EKGs/Labs/Other Studies Reviewed:    The following studies were reviewed today: none  EKG:  EKG  is not ordered today.    Recent Labs: 05/21/2017: TSH 1.510 05/25/2017: Magnesium 2.6 05/27/2017: BUN 16; Creatinine, Ser 0.72; Hemoglobin 10.5; Platelets 166; Potassium 4.2; Sodium 135 07/29/2017: ALT 25   Recent Lipid Panel    Component Value Date/Time   CHOL 163 07/29/2017 0745   TRIG 102 07/29/2017 0745   HDL 33 (L) 07/29/2017 0745   CHOLHDL 4.9 07/29/2017 0745   CHOLHDL 4.6 05/21/2017 0602   VLDL 17 05/21/2017 0602   LDLCALC 110 (  H) 07/29/2017 0745    Physical Exam:    VS:  BP 120/78   Pulse 71   Ht 5\' 10"  (1.778 m)   Wt 209 lb 12.8 oz (95.2 kg)   SpO2 97%   BMI 30.10 kg/m     Wt Readings from Last 3 Encounters:  09/16/17 209 lb 12.8 oz (95.2 kg)  06/27/17 212 lb (96.2 kg)  06/15/17 207 lb (93.9 kg)     GEN:  Well nourished, well developed in no acute distress HEENT: Normal NECK: No JVD; No carotid bruits LYMPHATICS: No lymphadenopathy CARDIAC: RRR, no murmurs, rubs, gallops RESPIRATORY:  Clear to auscultation without rales, wheezing or rhonchi  ABDOMEN: Soft, non-tender, non-distended MUSCULOSKELETAL:  No edema; No deformity  SKIN: Warm and dry NEUROLOGIC:  Alert and oriented x 3 PSYCHIATRIC:  Normal affect   ASSESSMENT:    1. Coronary artery disease due to lipid rich plaque   2. Essential hypertension   3. Pure hypercholesterolemia    PLAN:    In order of problems listed above:  1.  ASCAD - severe multivessel ASCAD s/p CABG 05/2017.  He denies any anginal sx and is doing very well.  He is participating in cardiac rehab.  He will continue on high dose statin and BB.  Decrease ASA to 81mg  daily.   2.  HTN - BP is well controlled on exam today.  He will continue on HCTZ 12.5mg  daily, Lopressor 12.5mg  BID.  Creatinine normal at 0.72 in May   3.  Hyperlipidemia with LDL goal < 70.  He has not had lipids checked since going on statin and has a lab appt in September.  He will continue on Zetia 10mg  daily and crestro 40mg  daily.     Medication Adjustments/Labs  and Tests Ordered: Current medicines are reviewed at length with the patient today.  Concerns regarding medicines are outlined above.  No orders of the defined types were placed in this encounter.  No orders of the defined types were placed in this encounter.   Signed, Armanda Magic, MD  09/18/2017 10:42 PM    [facility]

## 2017-09-16 ENCOUNTER — Ambulatory Visit: Payer: BLUE CROSS/BLUE SHIELD | Admitting: Cardiology

## 2017-09-16 ENCOUNTER — Encounter: Payer: Self-pay | Admitting: Cardiology

## 2017-09-16 VITALS — BP 120/78 | HR 71 | Ht 70.0 in | Wt 209.8 lb

## 2017-09-16 DIAGNOSIS — I2583 Coronary atherosclerosis due to lipid rich plaque: Secondary | ICD-10-CM | POA: Diagnosis not present

## 2017-09-16 DIAGNOSIS — E7800 Pure hypercholesterolemia, unspecified: Secondary | ICD-10-CM

## 2017-09-16 DIAGNOSIS — I10 Essential (primary) hypertension: Secondary | ICD-10-CM

## 2017-09-16 DIAGNOSIS — I2510 Atherosclerotic heart disease of native coronary artery without angina pectoris: Secondary | ICD-10-CM | POA: Diagnosis not present

## 2017-09-16 NOTE — Patient Instructions (Signed)
Your physician has recommended you make the following change in your medication: DECREASE ASPIRIN TO 81 MG EVERY DAY   Your physician wants you to follow-up in: 6 MONTHS WITH DR Mayford KnifeURNER   You will receive a reminder letter in the mail two months in advance. If you don't receive a letter, please call our office to schedule the follow-up appointment.

## 2017-09-26 MED ORDER — METOPROLOL TARTRATE 25 MG PO TABS: 12.5000 mg | ORAL_TABLET | Freq: Two times a day (BID) | ORAL | 3 refills | Status: DC

## 2017-09-27 ENCOUNTER — Other Ambulatory Visit: Payer: BLUE CROSS/BLUE SHIELD

## 2017-09-27 DIAGNOSIS — E785 Hyperlipidemia, unspecified: Secondary | ICD-10-CM

## 2017-09-27 LAB — HEPATIC FUNCTION PANEL
ALK PHOS: 44 IU/L (ref 39–117)
ALT: 36 IU/L (ref 0–44)
AST: 26 IU/L (ref 0–40)
Albumin: 4.7 g/dL (ref 3.6–4.8)
Bilirubin Total: 0.4 mg/dL (ref 0.0–1.2)
Bilirubin, Direct: 0.13 mg/dL (ref 0.00–0.40)
Total Protein: 6.6 g/dL (ref 6.0–8.5)

## 2017-09-27 LAB — LIPID PANEL
CHOLESTEROL TOTAL: 130 mg/dL (ref 100–199)
Chol/HDL Ratio: 3.3 ratio (ref 0.0–5.0)
HDL: 39 mg/dL — AB (ref >39)
LDL Calculated: 74 mg/dL (ref 0–99)
Triglycerides: 85 mg/dL (ref 0–149)
VLDL CHOLESTEROL CAL: 17 mg/dL (ref 5–40)

## 2017-10-10 ENCOUNTER — Telehealth: Payer: Self-pay

## 2017-10-10 DIAGNOSIS — E785 Hyperlipidemia, unspecified: Secondary | ICD-10-CM

## 2017-10-10 DIAGNOSIS — I2510 Atherosclerotic heart disease of native coronary artery without angina pectoris: Secondary | ICD-10-CM

## 2017-10-10 DIAGNOSIS — I2583 Coronary atherosclerosis due to lipid rich plaque: Secondary | ICD-10-CM

## 2017-10-10 DIAGNOSIS — E7800 Pure hypercholesterolemia, unspecified: Secondary | ICD-10-CM

## 2017-10-10 NOTE — Telephone Encounter (Signed)
-----   Message from Quintella Reichertraci R Turner, MD sent at 10/09/2017  6:56 PM EDT ----- Please refer lipid clinic to see if he qualifies for [facility] 9i  Traci Turner ----- Message ----- From: Dustin FlockKordsmeier, Giordano Getman G, RN Sent: 10/07/2017  11:33 AM EDT To: Quintella Reichertraci R Turner, MD  Hello Dr. Mayford Knifeurner,  The patient was wondering if he could have a different medication for cholesterol control. He is having leg soreness and he was wondering if there was an alternative.  Thanks, [employer]Ben

## 2017-10-10 NOTE — Telephone Encounter (Signed)
Left patient a voicemail to call back to discuss the lipid clinic referral.

## 2017-10-20 NOTE — Telephone Encounter (Signed)
Patient was scheduled for lipid clinic on 10/11.

## 2017-10-26 MED ORDER — HYDROCHLOROTHIAZIDE 12.5 MG PO CAPS: 12.5000 mg | ORAL_CAPSULE | Freq: Every day | ORAL | 3 refills | Status: DC

## 2017-10-26 NOTE — Telephone Encounter (Signed)
Spoke with the patient about the reorder, per Dr. Mayford Knife, he understood and accepted.

## 2017-10-28 ENCOUNTER — Ambulatory Visit (INDEPENDENT_AMBULATORY_CARE_PROVIDER_SITE_OTHER): Payer: BLUE CROSS/BLUE SHIELD | Admitting: Pharmacist

## 2017-10-28 DIAGNOSIS — E7800 Pure hypercholesterolemia, unspecified: Secondary | ICD-10-CM

## 2017-10-28 MED ORDER — ROSUVASTATIN CALCIUM 40 MG PO TABS: 20.0000 mg | ORAL_TABLET | Freq: Every day | ORAL | 1 refills | Status: DC

## 2017-10-28 NOTE — Patient Instructions (Addendum)
We will send for coverage of Repatha 140mg  every 14 days through your insurance.   Please call [phone] ([phone]) to activate your Repatha. Please write down the numbers provided so that the pharmacy can process the copay card.    Please call the clinic with any questions or concerns. [phone].   Cholesterol Cholesterol is a fat. Your body needs a small amount of cholesterol. Cholesterol (plaque) may build up in your blood vessels (arteries). That makes you more likely to have a heart attack or stroke. You cannot feel your cholesterol level. Having a blood test is the only way to find out if your level is high. Keep your test results. Work with your doctor to keep your cholesterol at a good level. What do the results mean?  Total cholesterol is how much cholesterol is in your blood.  LDL is bad cholesterol. This is the type that can build up. Try to have low LDL.  HDL is good cholesterol. It cleans your blood vessels and carries LDL away. Try to have high HDL.  Triglycerides are fat that the body can store or burn for energy. What are good levels of cholesterol?  Total cholesterol below 200.  LDL below 100 is good for people who have health risks. LDL below 70 is good for people who have very high risks.  HDL above 40 is good. It is best to have HDL of 60 or higher.  Triglycerides below 150. How can I lower my cholesterol? Diet Follow your diet program as told by your doctor.  Choose fish, white meat chicken, or [place] that is roasted or baked. Try not to eat red meat, fried foods, sausage, or lunch meats.  Eat lots of fresh fruits and vegetables.  Choose whole grains, beans, pasta, potatoes, and cereals.  Choose olive oil, corn oil, or canola oil. Only use small amounts.  Try not to eat butter, mayonnaise, shortening, or palm kernel oils.  Try not to eat foods with trans fats.  Choose low-fat or nonfat dairy foods. ? Drink skim or nonfat milk. ? Eat  low-fat or nonfat yogurt and cheeses. ? Try not to drink whole milk or cream. ? Try not to eat ice cream, egg yolks, or full-fat cheeses.  Healthy desserts include angel food cake, ginger snaps, animal crackers, hard candy, popsicles, and low-fat or nonfat frozen yogurt. Try not to eat pastries, cakes, pies, and cookies.  Exercise Follow your exercise program as told by your doctor.  Be more active. Try gardening, walking, and taking the stairs.  Ask your doctor about ways that you can be more active.  Medicine  Take over-the-counter and prescription medicines only as told by your doctor. This information is not intended to replace advice given to you by your health care provider. Make sure you discuss any questions you have with your health care provider. Document Released: 04/02/2008 Document Revised: 08/06/2015 Document Reviewed: 07/17/2015 Elsevier Interactive Patient Education  [employer].

## 2017-10-28 NOTE — Progress Notes (Signed)
Patient ID: [name]                 DOB: Feb 21, 1950                    MRN: [id number]     HPI: Tyrone Sherman is a 67 y.o. male patient of Dr. Mayford Knife that presents today for lipid evaluation.  PMH includes ASCAD, HTN and hyperlipidemia. Patient underwent cath showing severe multivessel CAD with normal LVF and underwent CABG 05/24/2017. He is currently taking rosuvastatin, ezetimibe, and fish oil and LDL is not at goal. He reported being unable to tolerate the rosuvastatin.   He presents today for discussion of cholesterol. He states that his muscles has been aching and headaches. He states that the pain is not unbearable, but bothersome. He was previously on atorvastatin and was unable to tolerate due to joint pain.   Risk Factors: CAD s/p CABG LDL Goal: <70  Current Medications: ezetimibe 10mg  daily, fish oil daily, rosuvastatin 40mg  daily  Intolerances: atorvastatin  (joint were aching, which improved after stopping)  Diet: He eats out and from home. He eats salad most of the time. He does not eat a lot red meat. He mostly eats chicken and fish. He eats most meats grilled or baked. He drinks mostly water or coffee with cream.   Exercise: He has been in cardiac rehab and plan signs up for [employer] after.   Family History: CAD in his brother, brother, brother, father, and mother; Hypertension (age of onset: 32) in his brother; Hypertension (age of onset: 84) in his brother; Hypertension (age of onset: 5) in his brother.  Social History: denies tobacco and alcohol  Labs: 09/27/17: TC 130, TG 85, HDL 39, LDL 74 (rosuvastatin 40mg  daily, ezetimibe 10mg  daily)  Past Medical History:  Diagnosis Date  . Allergic rhinitis   . Benign essential hypertension   . Chest tightness    EXERTIONAL  . DJD (degenerative joint disease)   . GERD (gastroesophageal reflux disease)   . Hyperlipidemia   . Hypertension   . Metatarsal fracture 2012   LEFT FOOT  . Obstructive sleep apnea on CPAP    . Onychomycosis   . Trigger thumb of right hand   . Ventral hernia   . Ventral hernia     Current Outpatient Medications on File Prior to Visit  Medication Sig Dispense Refill  . acetaminophen (TYLENOL) 325 MG tablet Take 2 tablets (650 mg total) by mouth every 6 (six) hours as needed for mild pain.    Marland Kitchen ALPRAZolam (XANAX) 0.25 MG tablet Take 0.25 mg by mouth at bedtime as needed for anxiety.    . Ascorbic Acid (VITAMIN C) 100 MG tablet Take 100 mg by mouth daily.    Marland Kitchen aspirin EC 81 MG tablet Take 81 mg by mouth daily.    . Cholecalciferol (VITAMIN D3) 2000 units TABS Take 2,000 Units by mouth daily.    . citalopram (CELEXA) 40 MG tablet Take 40 mg by mouth daily.    Marland Kitchen ezetimibe (ZETIA) 10 MG tablet Take 1 tablet (10 mg total) by mouth daily. 90 tablet 3  . folic acid (FOLVITE) 1 MG tablet Take 1 mg by mouth daily.    . Garlic 500 MG CAPS Take 500 mg by mouth.    . hydrochlorothiazide (MICROZIDE) 12.5 MG capsule Take 1 capsule (12.5 mg total) by mouth daily. 90 capsule 3  . metoprolol tartrate (LOPRESSOR) 25 MG tablet Take 0.5 tablets (  12.5 mg total) by mouth 2 (two) times daily. 90 tablet 3  . nitroGLYCERIN (NITROSTAT) 0.4 MG SL tablet 1 tablet sublingually Every 5 minutes as needed for chest pain not relieved with rest 30 days  3  . Omega 3 1200 MG CAPS Take 1,200 mg by mouth daily.    Marland Kitchen omeprazole (PRILOSEC) 20 MG capsule Take 20 mg by mouth 2 (two) times daily before a meal.    . rosuvastatin (CRESTOR) 40 MG tablet Take 1 tablet (40 mg total) by mouth daily. 30 tablet 1  . vitamin E 100 UNIT capsule Take 100 Units by mouth daily.      No current facility-administered medications on file prior to visit.     Allergies  Allergen Reactions  . Lipitor [Atorvastatin Calcium]     UNSPECIFIED REACTION   . Norvasc [Amlodipine Besylate]     UNSPECIFIED REACTION   . Prinivil [Lisinopril]     UNSPECIFIED REACTION     Assessment/Plan: Hyperlipidemia: LDL is not at goal. Discussed  options and patient is willing to stay on a lower dose of rosuvastatin. Will have him cut tablets in half and take 20mg  daily for several weeks. If pains do not improve can cut back further or stop altogether if needed. Will send for coverage of Repatha through insurance as LDL is above goal and likely to increase with decreasing statin. Discussing injection technique, risk/benefit, and cost (he should qualify for copay card given commercial prescription plan). He is aware that we will contact him once approved.    Thank you,  Tyrone Sherman, PharmD  [facility]  10/28/2017 6:59 AM

## 2017-11-01 ENCOUNTER — Telehealth: Payer: Self-pay | Admitting: Pharmacist

## 2017-11-01 MED ORDER — EVOLOCUMAB 140 MG/ML ~~LOC~~ SOAJ: 140.0000 mg | SUBCUTANEOUS | 11 refills | Status: DC

## 2017-11-01 NOTE — Telephone Encounter (Signed)
Pt returned call to clinic and is aware to pick up Repatha rx. He will call with any concerns and has the number to activate a copay card.

## 2017-11-01 NOTE — Telephone Encounter (Signed)
Pt approved for Repatha through the insurance.   RX sent to the pharmacy. LMOM to make pt aware.

## 2017-11-03 ENCOUNTER — Other Ambulatory Visit: Payer: Self-pay | Admitting: Pharmacist

## 2017-11-03 MED ORDER — EVOLOCUMAB 140 MG/ML ~~LOC~~ SOAJ: 140.0000 mg | SUBCUTANEOUS | 11 refills | Status: DC

## 2017-11-03 NOTE — Telephone Encounter (Signed)
Pt called clinic stating [facility] cannot fill his Repatha rx. Called pharmacy who stated patient's insurance requires him to use a specialty pharmacy but did not specify which one. Rx sent to [facility] specialty pharmacy in [place] and pt is aware. He will call with any issues.

## 2017-11-07 MED ORDER — EVOLOCUMAB 140 MG/ML ~~LOC~~ SOAJ: 1.0000 "pen " | SUBCUTANEOUS | 11 refills | Status: DC

## 2017-11-07 NOTE — Telephone Encounter (Signed)
Specialty [facility] unable to fill Repatha rx either, pt needs to use [facility]. New rx sent there, pt is aware.

## 2017-11-07 NOTE — Addendum Note (Signed)
Addended by: Gurkirat Basher E on: 11/07/2017 02:08 PM   Modules accepted: Orders

## 2017-12-08 ENCOUNTER — Telehealth: Payer: Self-pay

## 2017-12-08 DIAGNOSIS — E7800 Pure hypercholesterolemia, unspecified: Secondary | ICD-10-CM

## 2017-12-12 NOTE — Telephone Encounter (Signed)
Scheduled repatha lab for 01/14/19

## 2018-01-13 ENCOUNTER — Other Ambulatory Visit: Payer: BLUE CROSS/BLUE SHIELD | Admitting: *Deleted

## 2018-01-13 DIAGNOSIS — E7800 Pure hypercholesterolemia, unspecified: Secondary | ICD-10-CM

## 2018-01-13 LAB — HEPATIC FUNCTION PANEL
ALBUMIN: 4.2 g/dL (ref 3.6–4.8)
ALT: 31 IU/L (ref 0–44)
AST: 21 IU/L (ref 0–40)
Alkaline Phosphatase: 45 IU/L (ref 39–117)
BILIRUBIN TOTAL: 0.3 mg/dL (ref 0.0–1.2)
Bilirubin, Direct: 0.12 mg/dL (ref 0.00–0.40)
Total Protein: 6.2 g/dL (ref 6.0–8.5)

## 2018-01-13 LAB — LIPID PANEL
Chol/HDL Ratio: 1.6 ratio (ref 0.0–5.0)
Cholesterol, Total: 72 mg/dL — ABNORMAL LOW (ref 100–199)
HDL: 46 mg/dL (ref >39)
LDL Calculated: 17 mg/dL (ref 0–99)
Triglycerides: 47 mg/dL (ref 0–149)
VLDL Cholesterol Cal: 9 mg/dL (ref 5–40)

## 2018-05-31 MED ORDER — ALIROCUMAB 75 MG/ML ~~LOC~~ SOAJ: 1.0000 "pen " | SUBCUTANEOUS | 11 refills | Status: DC

## 2018-06-01 ENCOUNTER — Other Ambulatory Visit: Payer: Self-pay | Admitting: Pharmacist

## 2018-08-07 ENCOUNTER — Other Ambulatory Visit: Payer: Self-pay

## 2018-08-09 MED ORDER — EZETIMIBE 10 MG PO TABS: 10.0000 mg | ORAL_TABLET | Freq: Every day | ORAL | 0 refills | Status: DC

## 2018-09-03 NOTE — Progress Notes (Signed)
Virtual Visit via Video Note   This visit type was conducted due to national recommendations for restrictions regarding the COVID-19 Pandemic (e.g. social distancing) in an effort to limit this patient's exposure and mitigate transmission in our community.  Due to his co-morbid illnesses, this patient is at least at moderate risk for complications without adequate follow up.  This format is felt to be most appropriate for this patient at this time.  All issues noted in this document were discussed and addressed.  A limited physical exam was performed with this format.  Please refer to the patient's chart for his consent to telehealth for [facility].   Date:  09/04/2018   ID:  Tyrone Sherman, DOB 04-14-1950, MRN [id number]  Patient Location: Home Provider Location: Home  PCP:  Josetta Huddle, MD  Cardiologist:  Fransico Him, MD  Electrophysiologist:  None   Evaluation Performed:  Follow-Up Visit  Chief Complaint:  CAD  History of Present Illness:    Tyrone Sherman is a 68 y.o. male with a hx of ASCAD, HTN and hyperlipidemia. He presented for evaluation of CP and stress test c/w ischemia and patient underwent cath showing severe multivessel CAD with normal LVF and underwent CABG 05/24/2017 (LIMA to the LAD, sequential reverse saphenous vein graft to the acute marginal and posterior descending branches of the RCA, sequential reverse saphenous vein graft to the first and second obtuse marginal, reverse saphenous vein graft to the diagonal). He was seen by extender  06/15/2017 and was doing well.    09/16/17 pt was stable. .  Pt was on repatha  And LDL 17 then his insurance company changed so now on [place].  Time for recheck has been on 8 weeks.    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  He is also walking on treadmill at home.  His diet is healthy.  No questions about meds.     The patient does not have symptoms concerning for COVID-19 infection  (fever, chills, cough, or new shortness of breath).    Past Medical History:  Diagnosis Date  . Allergic rhinitis   . Benign essential hypertension   . Chest tightness    EXERTIONAL  . DJD (degenerative joint disease)   . GERD (gastroesophageal reflux disease)   . Hyperlipidemia   . Hypertension   . Metatarsal fracture 2012   LEFT FOOT  . Obstructive sleep apnea on CPAP   . Onychomycosis   . Trigger thumb of right hand   . Ventral hernia   . Ventral hernia    Past Surgical History:  Procedure Laterality Date  . CORONARY ARTERY BYPASS GRAFT N/A 05/24/2017   Procedure: CORONARY ARTERY BYPASS GRAFTING (CABG);  Surgeon: Grace Isaac, MD;  Location: [facility];  Service: Open Heart Surgery;  Laterality: N/A;  Times 6 using left internal mammary artery to LAD and endoscopically harvested right saphenous vein to Diagonal, OM1, OM2, Acute Marginal, and PDA  . Incarcerated umbilical hernia repair    . LEFT HEART CATH AND CORONARY ANGIOGRAPHY N/A 05/20/2017   Procedure: LEFT HEART CATH AND CORONARY ANGIOGRAPHY;  Surgeon: Jettie Booze, MD;  Location: [place] [facility];  Service: Cardiovascular;  Laterality: N/A;  . Left metatarsal fracture repair    . Left varicocele repair 1973    . TEE WITHOUT CARDIOVERSION N/A 05/24/2017   Procedure: TRANSESOPHAGEAL ECHOCARDIOGRAM (TEE);  Surgeon: Grace Isaac, MD;  Location: [place];  Service: Open Heart  Surgery;  Laterality: N/A;     Current Meds  Medication Sig  . acetaminophen (TYLENOL) 325 MG tablet Take 2 tablets (650 mg total) by mouth every 6 (six) hours as needed for mild pain.  . Alirocumab (PRALUENT) 75 MG/ML SOAJ Inject 1 pen into the skin every 14 (fourteen) days.  . ALPRAZolam (XANAX) 0.25 MG tablet Take 0.25 mg by mouth at bedtime as needed for anxiety.  . Ascorbic Acid (VITAMIN C) 100 MG tablet Take 100 mg by mouth daily.  Marland Kitchen aspirin EC 81 MG tablet Take 81 mg by mouth daily.  . Cholecalciferol (VITAMIN D3) 2000 units TABS Take  2,000 Units by mouth daily.  . citalopram (CELEXA) 40 MG tablet Take 40 mg by mouth daily.  Marland Kitchen ezetimibe (ZETIA) 10 MG tablet Take 1 tablet (10 mg total) by mouth daily.  . folic acid (FOLVITE) 1 MG tablet Take 1 mg by mouth daily.  . Garlic 878 MG CAPS Take 500 mg by mouth.  . hydrochlorothiazide (MICROZIDE) 12.5 MG capsule Take 1 capsule (12.5 mg total) by mouth daily.  . metoprolol tartrate (LOPRESSOR) 25 MG tablet Take 0.5 tablets (12.5 mg total) by mouth 2 (two) times daily.  . nitroGLYCERIN (NITROSTAT) 0.4 MG SL tablet 1 tablet sublingually Every 5 minutes as needed for chest pain not relieved with rest 30 days  . Omega 3 1200 MG CAPS Take 1,200 mg by mouth daily.  Marland Kitchen omeprazole (PRILOSEC) 20 MG capsule Take 20 mg by mouth 2 (two) times daily before a meal.  . rosuvastatin (CRESTOR) 40 MG tablet Take 0.5 tablets (20 mg total) by mouth daily.  . vitamin E 100 UNIT capsule Take 100 Units by mouth daily.   . [DISCONTINUED] ezetimibe (ZETIA) 10 MG tablet Take 1 tablet (10 mg total) by mouth daily. Please make annual appt for future refills. [phone]. 1st attempt.     Allergies:   Lipitor [atorvastatin calcium], Norvasc [amlodipine besylate], and Prinivil [lisinopril]   Social History   Tobacco Use  . Smoking status: Never Smoker  . Smokeless tobacco: Never Used  Substance Use Topics  . Alcohol use: Never    Frequency: Never  . Drug use: Never     Family Hx: The patient's family history includes CAD in his brother, brother, brother, father, and mother; Hypertension (age of onset: 58) in his brother; Hypertension (age of onset: 54) in his brother; Hypertension (age of onset: 104) in his brother; Other in his father and mother.  ROS:   Please see the history of present illness.    General:no colds or fevers, no weight changes Skin:no rashes or ulcers HEENT:no blurred vision, no congestion CV:see HPI PUL:see HPI GI:no diarrhea constipation or melena, no indigestion GU:no  hematuria, no dysuria MS:no joint pain, no claudication Neuro:no syncope, no lightheadedness Endo:no diabetes, no thyroid disease  All other systems reviewed and are negative.   Prior CV studies:   The following studies were reviewed today:  Cardiac cath 05/20/17  Prox LAD lesion is 80% stenosed.  Ost 1st Diag lesion is 80% stenosed.  Mid Cx lesion is 95% stenosed.  Ost 2nd Mrg lesion is 70% stenosed.  Mid RCA lesion is 90% stenosed.  Post Atrio lesion is 90% stenosed.  The left ventricular ejection fraction is 55-65% by visual estimate.  The left ventricular systolic function is normal.  LV end diastolic pressure is normal.  There is no aortic valve stenosis.   Severe three vessel CAD.  Given accelerating symptoms and angina at rest,  will admit for inpatient TCTS consult.  Start heparin 8 hours post sheath pull.   Labs/Other Tests and Data Reviewed:    EKG:  An ECG dated 05/25/17 was personally reviewed today and demonstrated:  SR with LAD, and pulmonary disease pattern.    Recent Labs: 01/13/2018: ALT 31   Recent Lipid Panel Lab Results  Component Value Date/Time   CHOL 72 (L) 01/13/2018 08:05 AM   TRIG 47 01/13/2018 08:05 AM   HDL 46 01/13/2018 08:05 AM   CHOLHDL 1.6 01/13/2018 08:05 AM   CHOLHDL 4.6 05/21/2017 06:02 AM   LDLCALC 17 01/13/2018 08:05 AM    Wt Readings from Last 3 Encounters:  09/04/18 204 lb (92.5 kg)  09/16/17 209 lb 12.8 oz (95.2 kg)  06/27/17 212 lb (96.2 kg)     Objective:    Vital Signs:  BP 123/71 (BP Location: Left Arm, Patient Position: Sitting, Cuff Size: Normal)   Pulse 67   Ht '5\' 10"'$  (1.778 m)   Wt 204 lb (92.5 kg)   BMI 29.27 kg/m    VITAL SIGNS:  reviewed  General NAD Lungs speaks in complete sentences without SOB Neuro A&O X 3 MAE follows commands Psych: pleasant affect  ASSESSMENT & PLAN:    1. CAD with hx CABG 2019 stable on BB ASA statin and exercising. Continue 2. HLD on statin zetia and pravulent will  recheck lipids 3. HTN controlled.  No change in meds.   COVID-19 Education: The signs and symptoms of COVID-19 were discussed with the patient and how to seek care for testing (follow up with PCP or arrange E-visit).  The importance of social distancing was discussed today.  Time:   Today, I have spent 10 minutes with the patient with telehealth technology discussing the above problems.     Medication Adjustments/Labs and Tests Ordered: Current medicines are reviewed at length with the patient today.  Concerns regarding medicines are outlined above.   Tests Ordered: Orders Placed This Encounter  Procedures  . Comp Met (CMET)  . Lipid panel    Medication Changes: Meds ordered this encounter  Medications  . ezetimibe (ZETIA) 10 MG tablet    Sig: Take 1 tablet (10 mg total) by mouth daily.    Dispense:  90 tablet    Refill:  3    Follow Up:  Virtual Visit in 1 year(s)  Signed, Cecilie Kicks, NP  09/04/2018 8:55 AM    [place]

## 2018-09-04 ENCOUNTER — Telehealth (INDEPENDENT_AMBULATORY_CARE_PROVIDER_SITE_OTHER): Payer: Medicare Other | Admitting: Cardiology

## 2018-09-04 ENCOUNTER — Other Ambulatory Visit: Payer: Self-pay

## 2018-09-04 ENCOUNTER — Encounter: Payer: Self-pay | Admitting: Cardiology

## 2018-09-04 VITALS — BP 123/71 | HR 67 | Ht 70.0 in | Wt 204.0 lb

## 2018-09-04 DIAGNOSIS — I2510 Atherosclerotic heart disease of native coronary artery without angina pectoris: Secondary | ICD-10-CM

## 2018-09-04 DIAGNOSIS — E7800 Pure hypercholesterolemia, unspecified: Secondary | ICD-10-CM

## 2018-09-04 DIAGNOSIS — I10 Essential (primary) hypertension: Secondary | ICD-10-CM

## 2018-09-04 DIAGNOSIS — E785 Hyperlipidemia, unspecified: Secondary | ICD-10-CM

## 2018-09-04 DIAGNOSIS — I2583 Coronary atherosclerosis due to lipid rich plaque: Secondary | ICD-10-CM | POA: Diagnosis not present

## 2018-09-04 MED ORDER — EZETIMIBE 10 MG PO TABS: 10.0000 mg | ORAL_TABLET | Freq: Every day | ORAL | 3 refills | Status: DC

## 2018-09-04 NOTE — Patient Instructions (Signed)
Medication Instructions:  Your physician recommends that you continue on your current medications as directed. Please refer to the Current Medication list given to you today.  We have sent in the refill for your Zetia to [facility], for 1 year.  If you need a refill on your cardiac medications before your next appointment, please call your pharmacy.   Lab work: 09/11/2018 ANYTIME THAT MORNING COME IN FOR FASTING, NOTHING TO EAT OR DRINK AFTER MIDNIGHT THE NIGHT BEFORE, FOR CHOLESTEROL AND LIVER CHECK  If you have labs (blood work) drawn today and your tests are completely normal, you will receive your results only by: Marland Kitchen MyChart Message (if you have MyChart) OR . A paper copy in the mail If you have any lab test that is abnormal or we need to change your treatment, we will call you to review the results.  Testing/Procedures: None ordered  Follow-Up: At [facility], you and your health needs are our priority.  As part of our continuing mission to provide you with exceptional heart care, we have created designated Provider Care Teams.  These Care Teams include your primary Cardiologist (physician) and Advanced Practice Providers (APPs -  Physician Assistants and Nurse Practitioners) who all work together to provide you with the care you need, when you need it. You will need a follow up appointment in 12 months.  Please call our office 2 months in advance to schedule this appointment.  You may see Fransico Him, MD or one of the following Advanced Practice Providers on your designated Care Team:   [place], PA-C Melina Copa, PA-C . Ermalinda Barrios, PA-C  Any Other Special Instructions Will Be Listed Below (If Applicable).

## 2018-09-11 ENCOUNTER — Other Ambulatory Visit: Payer: Self-pay

## 2018-09-11 ENCOUNTER — Other Ambulatory Visit: Payer: Medicare Other | Admitting: *Deleted

## 2018-09-11 DIAGNOSIS — E785 Hyperlipidemia, unspecified: Secondary | ICD-10-CM

## 2018-09-11 DIAGNOSIS — E7800 Pure hypercholesterolemia, unspecified: Secondary | ICD-10-CM

## 2018-09-11 LAB — COMPREHENSIVE METABOLIC PANEL
ALT: 32 IU/L (ref 0–44)
AST: 19 IU/L (ref 0–40)
Albumin/Globulin Ratio: 2.2 (ref 1.2–2.2)
Albumin: 4.3 g/dL (ref 3.8–4.8)
Alkaline Phosphatase: 40 IU/L (ref 39–117)
BUN/Creatinine Ratio: 16 (ref 10–24)
BUN: 14 mg/dL (ref 8–27)
Bilirubin Total: 0.5 mg/dL (ref 0.0–1.2)
CO2: 24 mmol/L (ref 20–29)
Calcium: 9.3 mg/dL (ref 8.6–10.2)
Chloride: 107 mmol/L — ABNORMAL HIGH (ref 96–106)
Creatinine, Ser: 0.88 mg/dL (ref 0.76–1.27)
GFR calc Af Amer: 103 mL/min/{1.73_m2} (ref >59)
GFR calc non Af Amer: 90 mL/min/{1.73_m2} (ref >59)
Globulin, Total: 2 g/dL (ref 1.5–4.5)
Glucose: 94 mg/dL (ref 65–99)
Potassium: 4.5 mmol/L (ref 3.5–5.2)
Sodium: 146 mmol/L — ABNORMAL HIGH (ref 134–144)
Total Protein: 6.3 g/dL (ref 6.0–8.5)

## 2018-09-11 LAB — LIPID PANEL
Chol/HDL Ratio: 2 ratio (ref 0.0–5.0)
Cholesterol, Total: 86 mg/dL — ABNORMAL LOW (ref 100–199)
HDL: 43 mg/dL (ref >39)
LDL Calculated: 29 mg/dL (ref 0–99)
Triglycerides: 69 mg/dL (ref 0–149)
VLDL Cholesterol Cal: 14 mg/dL (ref 5–40)

## 2018-09-14 ENCOUNTER — Telehealth: Payer: Self-pay

## 2018-09-14 NOTE — Telephone Encounter (Signed)
-----   Message from Consuelo Pandy, [place] sent at 09/13/2018  7:48 PM EDT ----- Cholesterol looks great on current regimen.  Liver enzymes normal. No changes recommended at this time.

## 2018-09-14 NOTE — Telephone Encounter (Signed)
Notes recorded by Frederik Schmidt, RN on 09/14/2018 at 8:34 AM EDT  The patient has been notified of the result and verbalized understanding. All questions (if any) were answered.  Frederik Schmidt, RN 09/14/2018 8:34 AM

## 2018-10-29 ENCOUNTER — Other Ambulatory Visit: Payer: Self-pay | Admitting: Cardiology

## 2018-11-22 ENCOUNTER — Other Ambulatory Visit: Payer: Self-pay | Admitting: Cardiology

## 2018-11-22 MED ORDER — METOPROLOL TARTRATE 25 MG PO TABS: 12.5000 mg | ORAL_TABLET | Freq: Two times a day (BID) | ORAL | 2 refills | Status: DC

## 2019-05-07 ENCOUNTER — Other Ambulatory Visit: Payer: Self-pay | Admitting: Cardiology

## 2019-05-10 ENCOUNTER — Other Ambulatory Visit: Payer: Self-pay | Admitting: Pharmacist

## 2019-05-10 MED ORDER — PRALUENT 75 MG/ML ~~LOC~~ SOAJ: 1.0000 "pen " | SUBCUTANEOUS | 11 refills | Status: DC

## 2019-08-02 ENCOUNTER — Other Ambulatory Visit: Payer: Self-pay | Admitting: Cardiology

## 2019-08-18 ENCOUNTER — Other Ambulatory Visit: Payer: Self-pay | Admitting: Cardiology

## 2019-09-01 ENCOUNTER — Other Ambulatory Visit: Payer: Self-pay | Admitting: Cardiology

## 2019-09-20 ENCOUNTER — Ambulatory Visit: Payer: Medicare Other | Admitting: Cardiology

## 2019-09-20 ENCOUNTER — Encounter: Payer: Self-pay | Admitting: Cardiology

## 2019-09-20 ENCOUNTER — Other Ambulatory Visit: Payer: Self-pay

## 2019-09-20 VITALS — BP 138/84 | HR 66 | Ht 70.0 in | Wt 213.4 lb

## 2019-09-20 DIAGNOSIS — E7800 Pure hypercholesterolemia, unspecified: Secondary | ICD-10-CM | POA: Diagnosis not present

## 2019-09-20 DIAGNOSIS — I10 Essential (primary) hypertension: Secondary | ICD-10-CM

## 2019-09-20 DIAGNOSIS — I2583 Coronary atherosclerosis due to lipid rich plaque: Secondary | ICD-10-CM

## 2019-09-20 DIAGNOSIS — I2510 Atherosclerotic heart disease of native coronary artery without angina pectoris: Secondary | ICD-10-CM

## 2019-09-20 NOTE — Patient Instructions (Signed)

## 2019-09-20 NOTE — Progress Notes (Addendum)
Cardiology Office Note:    Date:  09/20/2019   ID:  Tyrone Sherman, DOB Jun 05, 1950, MRN [id number]  PCP:  Marden Noble, MD  Cardiologist:  Armanda Magic, MD    Referring MD: Marden Noble, MD   Chief Complaint  Patient presents with  . Coronary Artery Disease  . Hypertension  . Hyperlipidemia    History of Present Illness:    Tyrone Sherman is a 69 y.o. male with a hx of ASCAD, HTN and hyperlipidemia. He presented for evaluation of CP and stress test c/w ischemia and patient underwent cath showing severe multivessel CAD with normal LVF and underwent CABG 05/24/2017 (LIMA to the LAD, sequential reverse saphenous vein graft to the acute marginal and posterior descending branches of the RCA, sequential reverse saphenous vein graft to the first and second obtuse marginal, reverse saphenous vein graft to the diagonal).   He is here today for followup and is doing well.  He denies any chest pain or pressure, SOB, DOE, PND, orthopnea, LE edema, dizziness, palpitations or syncope. he is compliant with his meds and is tolerating meds with no SE.    Past Medical History:  Diagnosis Date  . Allergic rhinitis   . Benign essential hypertension   . Chest tightness    EXERTIONAL  . DJD (degenerative joint disease)   . GERD (gastroesophageal reflux disease)   . Hyperlipidemia   . Hypertension   . Metatarsal fracture 2012   LEFT FOOT  . Obstructive sleep apnea on CPAP   . Onychomycosis   . Trigger thumb of right hand   . Ventral hernia   . Ventral hernia     Past Surgical History:  Procedure Laterality Date  . CORONARY ARTERY BYPASS GRAFT N/A 05/24/2017   Procedure: CORONARY ARTERY BYPASS GRAFTING (CABG);  Surgeon: Delight Ovens, MD;  Location: [facility] OR;  Service: Open Heart Surgery;  Laterality: N/A;  Times 6 using left internal mammary artery to LAD and endoscopically harvested right saphenous vein to Diagonal, OM1, OM2, Acute Marginal, and PDA  . Incarcerated umbilical hernia repair     . LEFT HEART CATH AND CORONARY ANGIOGRAPHY N/A 05/20/2017   Procedure: LEFT HEART CATH AND CORONARY ANGIOGRAPHY;  Surgeon: Corky Crafts, MD;  Location: [facility] IN[facility]IVE [facility];  Service: Cardiovascular;  Laterality: N/A;  . Left metatarsal fracture repair    . Left varicocele repair 1973    . TEE WITHOUT CARDIOVERSION N/A 05/24/2017   Procedure: TRANSESOPHAGEAL ECHOCARDIOGRAM (TEE);  Surgeon: Delight Ovens, MD;  Location: [facility] OR;  Service: Open Heart Surgery;  Laterality: N/A;    Current Medications: Current Meds  Medication Sig  . acetaminophen (TYLENOL) 325 MG tablet Take 2 tablets (650 mg total) by mouth every 6 (six) hours as needed for mild pain.  . Alirocumab (PRALUENT) 75 MG/ML SOAJ Inject 1 pen into the skin every 14 (fourteen) days.  . ALPRAZolam (XANAX) 0.25 MG tablet Take 0.25 mg by mouth at bedtime as needed for anxiety.  . Ascorbic Acid (VITAMIN C) 100 MG tablet Take 100 mg by mouth daily.  Marland Kitchen aspirin EC 81 MG tablet Take 81 mg by mouth daily.  . Cholecalciferol (VITAMIN D3) 2000 units TABS Take 2,000 Units by mouth daily.  . citalopram (CELEXA) 40 MG tablet Take 40 mg by mouth daily.  Marland Kitchen ezetimibe (ZETIA) 10 MG tablet Take 1 tablet by mouth once daily  . folic acid (FOLVITE) 1 MG tablet Take 1 mg by mouth daily.  . Garlic 500 MG  CAPS Take 500 mg by mouth.  . hydrochlorothiazide (MICROZIDE) 12.5 MG capsule Take 1 capsule (12.5 mg total) by mouth daily. Please make yearly appt with Dr. Mayford Knife for August for future refills. 1st attempt  . metoprolol tartrate (LOPRESSOR) 25 MG tablet Take 1/2 (one-half) tablet by mouth twice daily  . nitroGLYCERIN (NITROSTAT) 0.4 MG SL tablet 1 tablet sublingually Every 5 minutes as needed for chest pain not relieved with rest 30 days  . Omega 3 1200 MG CAPS Take 1,200 mg by mouth daily.  Marland Kitchen omeprazole (PRILOSEC) 20 MG capsule Take 20 mg by mouth 2 (two) times daily before a meal.  . rosuvastatin (CRESTOR) 40 MG tablet Take 0.5 tablets (20 mg  total) by mouth daily.  . vitamin E 100 UNIT capsule Take 100 Units by mouth daily.      Allergies:   Lipitor [atorvastatin calcium], Norvasc [amlodipine besylate], and Prinivil [lisinopril]   Social History   Socioeconomic History  . Marital status: Married    Spouse name: Not on file  . Number of children: Not on file  . Years of education: Not on file  . Highest education level: Not on file  Occupational History  . Occupation: Building control surveyor  Tobacco Use  . Smoking status: Never Smoker  . Smokeless tobacco: Never Used  Vaping Use  . Vaping Use: Never used  Substance and Sexual Activity  . Alcohol use: Never  . Drug use: Never  . Sexual activity: Yes  Other Topics Concern  . Not on file  Social History Narrative  . Not on file   Social Determinants of Health   Financial Resource Strain:   . Difficulty of Paying Living Expenses: Not on file  Food Insecurity:   . Worried About Programme researcher, broadcasting/film/video in the Last Year: Not on file  . Ran Out of Food in the Last Year: Not on file  Transportation Needs:   . Lack of Transportation (Medical): Not on file  . Lack of Transportation (Non-Medical): Not on file  Physical Activity:   . Days of Exercise per Week: Not on file  . Minutes of Exercise per Session: Not on file  Stress:   . Feeling of Stress : Not on file  Social Connections:   . Frequency of Communication with Friends and Family: Not on file  . Frequency of Social Gatherings with Friends and Family: Not on file  . Attends Religious Services: Not on file  . Active Member of Clubs or Organizations: Not on file  . Attends Banker Meetings: Not on file  . Marital Status: Not on file     Family History: The patient's family history includes CAD in his brother, brother, brother, father, and mother; Hypertension (age of onset: 68) in his brother; Hypertension (age of onset: 37) in his brother; Hypertension (age of onset: 76) in his brother; Other in his  father and mother.  ROS:   Please see the history of present illness.    ROS  All other systems reviewed and negative.   EKGs/Labs/Other Studies Reviewed:    The following studies were reviewed today: EKG  EKG:  EKG is  ordered today.  The ekg ordered today demonstrates NSR with septal infarct, nonspecific T wave abnormality  Recent Labs: No results found for requested labs within last 8760 hours.   Recent Lipid Panel    Component Value Date/Time   CHOL 86 (L) 09/11/2018 0852   TRIG 69 09/11/2018 0852   HDL 43 09/11/2018  0852   CHOLHDL 2.0 09/11/2018 0852   CHOLHDL 4.6 05/21/2017 0602   VLDL 17 05/21/2017 0602   LDLCALC 29 09/11/2018 0852    Physical Exam:    VS:  BP 138/84   Pulse 66   Ht 5\' 10"  (1.778 m)   Wt 213 lb 6.4 oz (96.8 kg)   BMI 30.62 kg/m     Wt Readings from Last 3 Encounters:  09/20/19 213 lb 6.4 oz (96.8 kg)  09/04/18 204 lb (92.5 kg)  09/16/17 209 lb 12.8 oz (95.2 kg)     GEN:  Well nourished, well developed in no acute distress HEENT: Normal NECK: No JVD; No carotid bruits LYMPHATICS: No lymphadenopathy CARDIAC: RRR, no murmurs, rubs, gallops RESPIRATORY:  Clear to auscultation without rales, wheezing or rhonchi  ABDOMEN: Soft, non-tender, non-distended MUSCULOSKELETAL:  No edema; No deformity  SKIN: Warm and dry NEUROLOGIC:  Alert and oriented x 3 PSYCHIATRIC:  Normal affect   ASSESSMENT:    1. Coronary artery disease due to lipid rich plaque   2. Essential hypertension   3. Pure hypercholesterolemia    PLAN:    In order of problems listed above:  1.  ASCAD -cath 2019 showed severe multivessel CAD with normal LVF and underwent CABG 05/24/2017 (LIMA to the LAD, sequential reverse saphenous vein graft to the acute marginal and posterior descending branches of the RCA, sequential reverse saphenous vein graft to the first and second obtuse marginal, reverse saphenous vein graft to the diagonal -he has not had any anginal symptoms -his  EKG shows new nonspecific T wave abnormalities in anterior leads that are subtle but do not think any further workup is indicated at this time since he is asymptomatic and very active. He will let me know if he develops any chest tightness or SOB.  -continue ASA 81mg  daily, BB and statin  2.  HTN -BP controlled one exam -continue HCTZ 12.5mg  daily and Lopressor 12.5mg  BID -SCr was 0.69 and K+ 4.2 in June 2021  3.  HLD -LDL goal < 70 -LDL was 10 in June -his PCP decreased Crestor to 20mg  daily due to drop in LDL -continue Praluent, Zetia 10mg  daily and Crestor 20mg  daily   Medication Adjustments/Labs and Tests Ordered: Current medicines are reviewed at length with the patient today.  Concerns regarding medicines are outlined above.  Orders Placed This Encounter  Procedures  . EKG 12-Lead   No orders of the defined types were placed in this encounter.   Signed, July 2021, MD  09/20/2019 9:50 AM    [facility]

## 2019-10-31 ENCOUNTER — Other Ambulatory Visit: Payer: Self-pay | Admitting: Cardiology

## 2019-10-31 NOTE — Telephone Encounter (Signed)
Rx request sent to pharmacy.  

## 2019-11-20 ENCOUNTER — Other Ambulatory Visit: Payer: Self-pay | Admitting: Cardiology

## 2019-11-29 ENCOUNTER — Other Ambulatory Visit: Payer: Self-pay | Admitting: Cardiology

## 2020-02-13 DIAGNOSIS — G4733 Obstructive sleep apnea (adult) (pediatric): Secondary | ICD-10-CM | POA: Diagnosis not present

## 2020-03-11 DIAGNOSIS — I10 Essential (primary) hypertension: Secondary | ICD-10-CM | POA: Diagnosis not present

## 2020-03-11 DIAGNOSIS — K219 Gastro-esophageal reflux disease without esophagitis: Secondary | ICD-10-CM | POA: Diagnosis not present

## 2020-03-11 DIAGNOSIS — I2510 Atherosclerotic heart disease of native coronary artery without angina pectoris: Secondary | ICD-10-CM | POA: Diagnosis not present

## 2020-03-11 DIAGNOSIS — E782 Mixed hyperlipidemia: Secondary | ICD-10-CM | POA: Diagnosis not present

## 2020-03-26 IMAGING — DX DG CHEST 2V
2 series · 2 of 2 positions shown · non-contrast
Comparison: None.

CLINICAL DATA: Angina pectoris

EXAM:
CHEST - 2 VIEW

[chest pa]
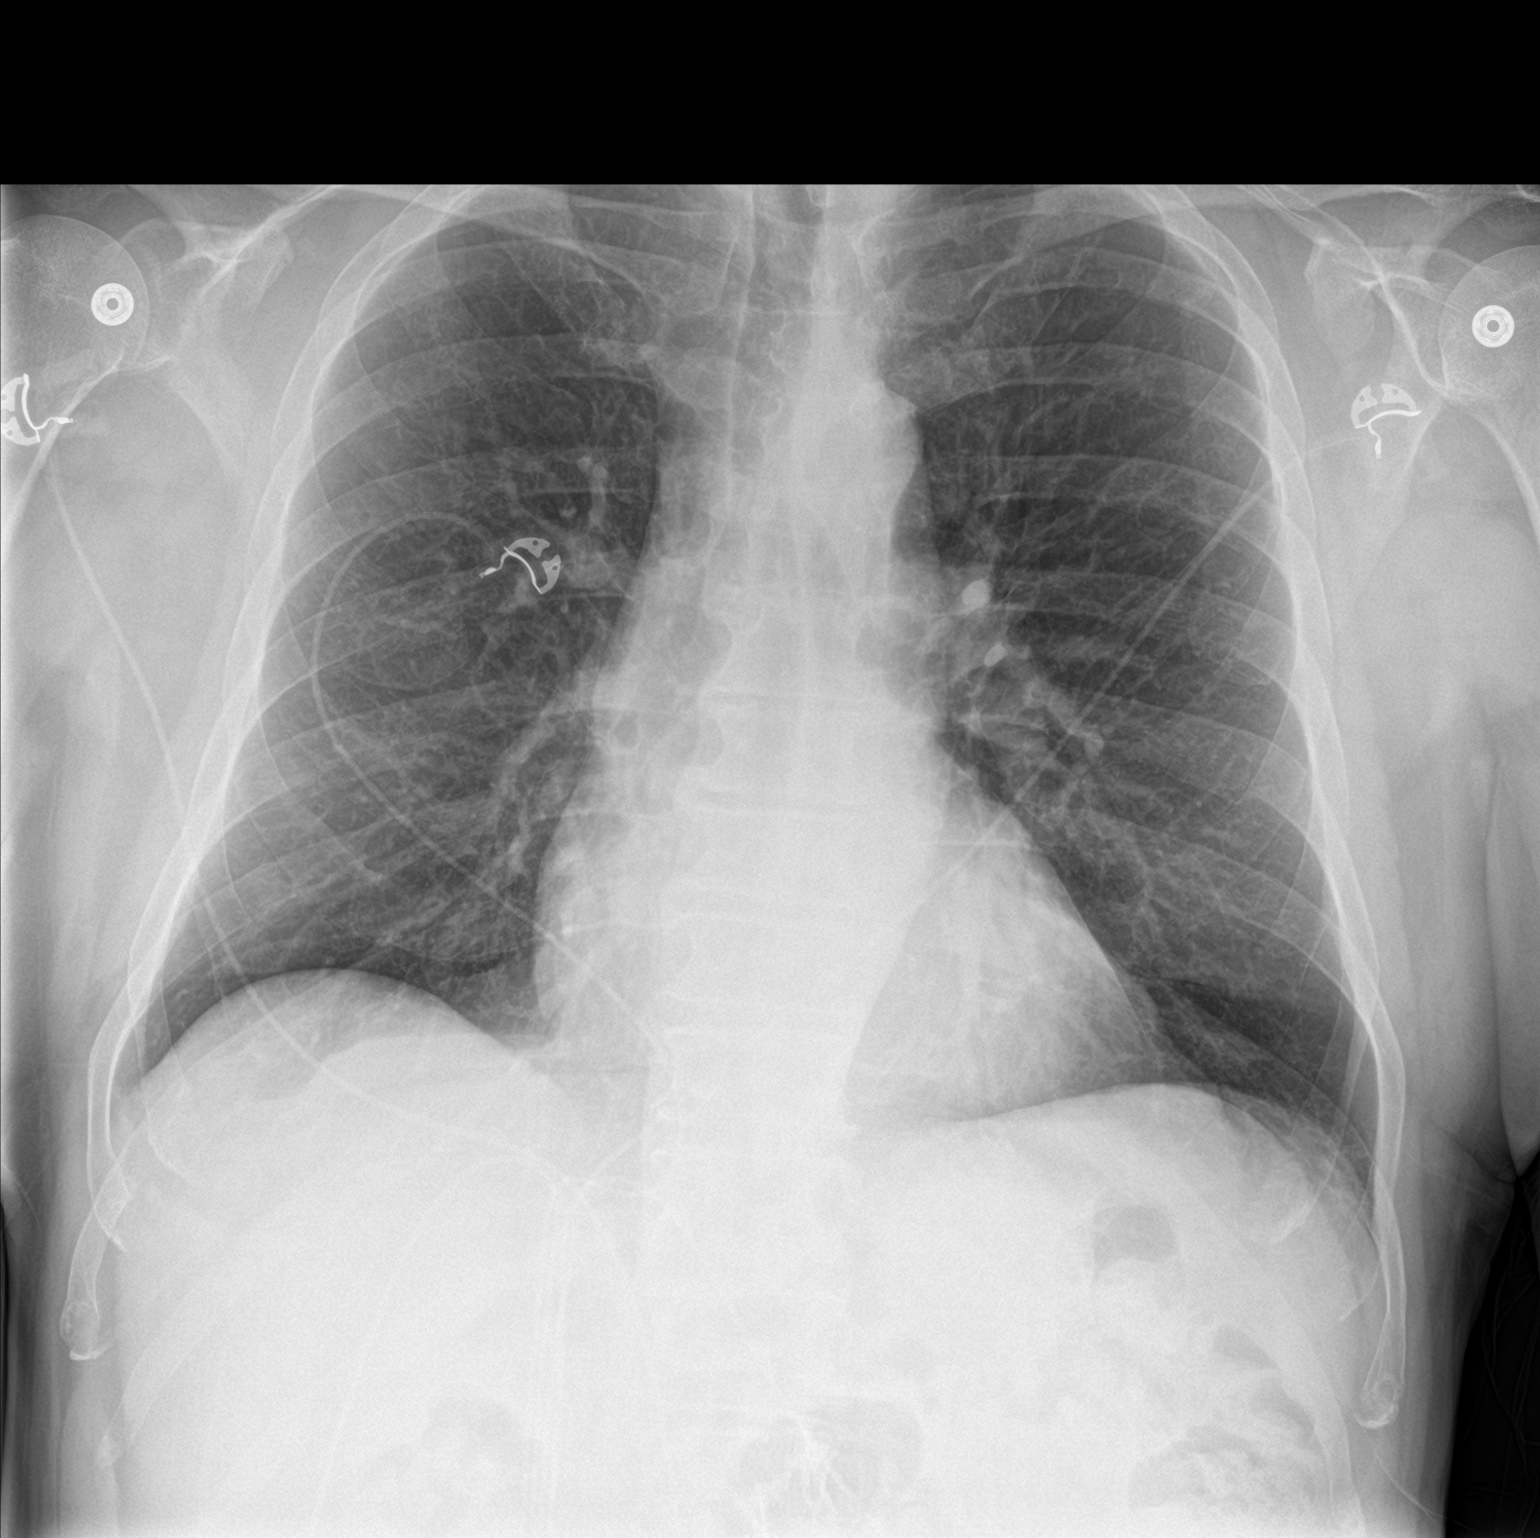

[chest lat]
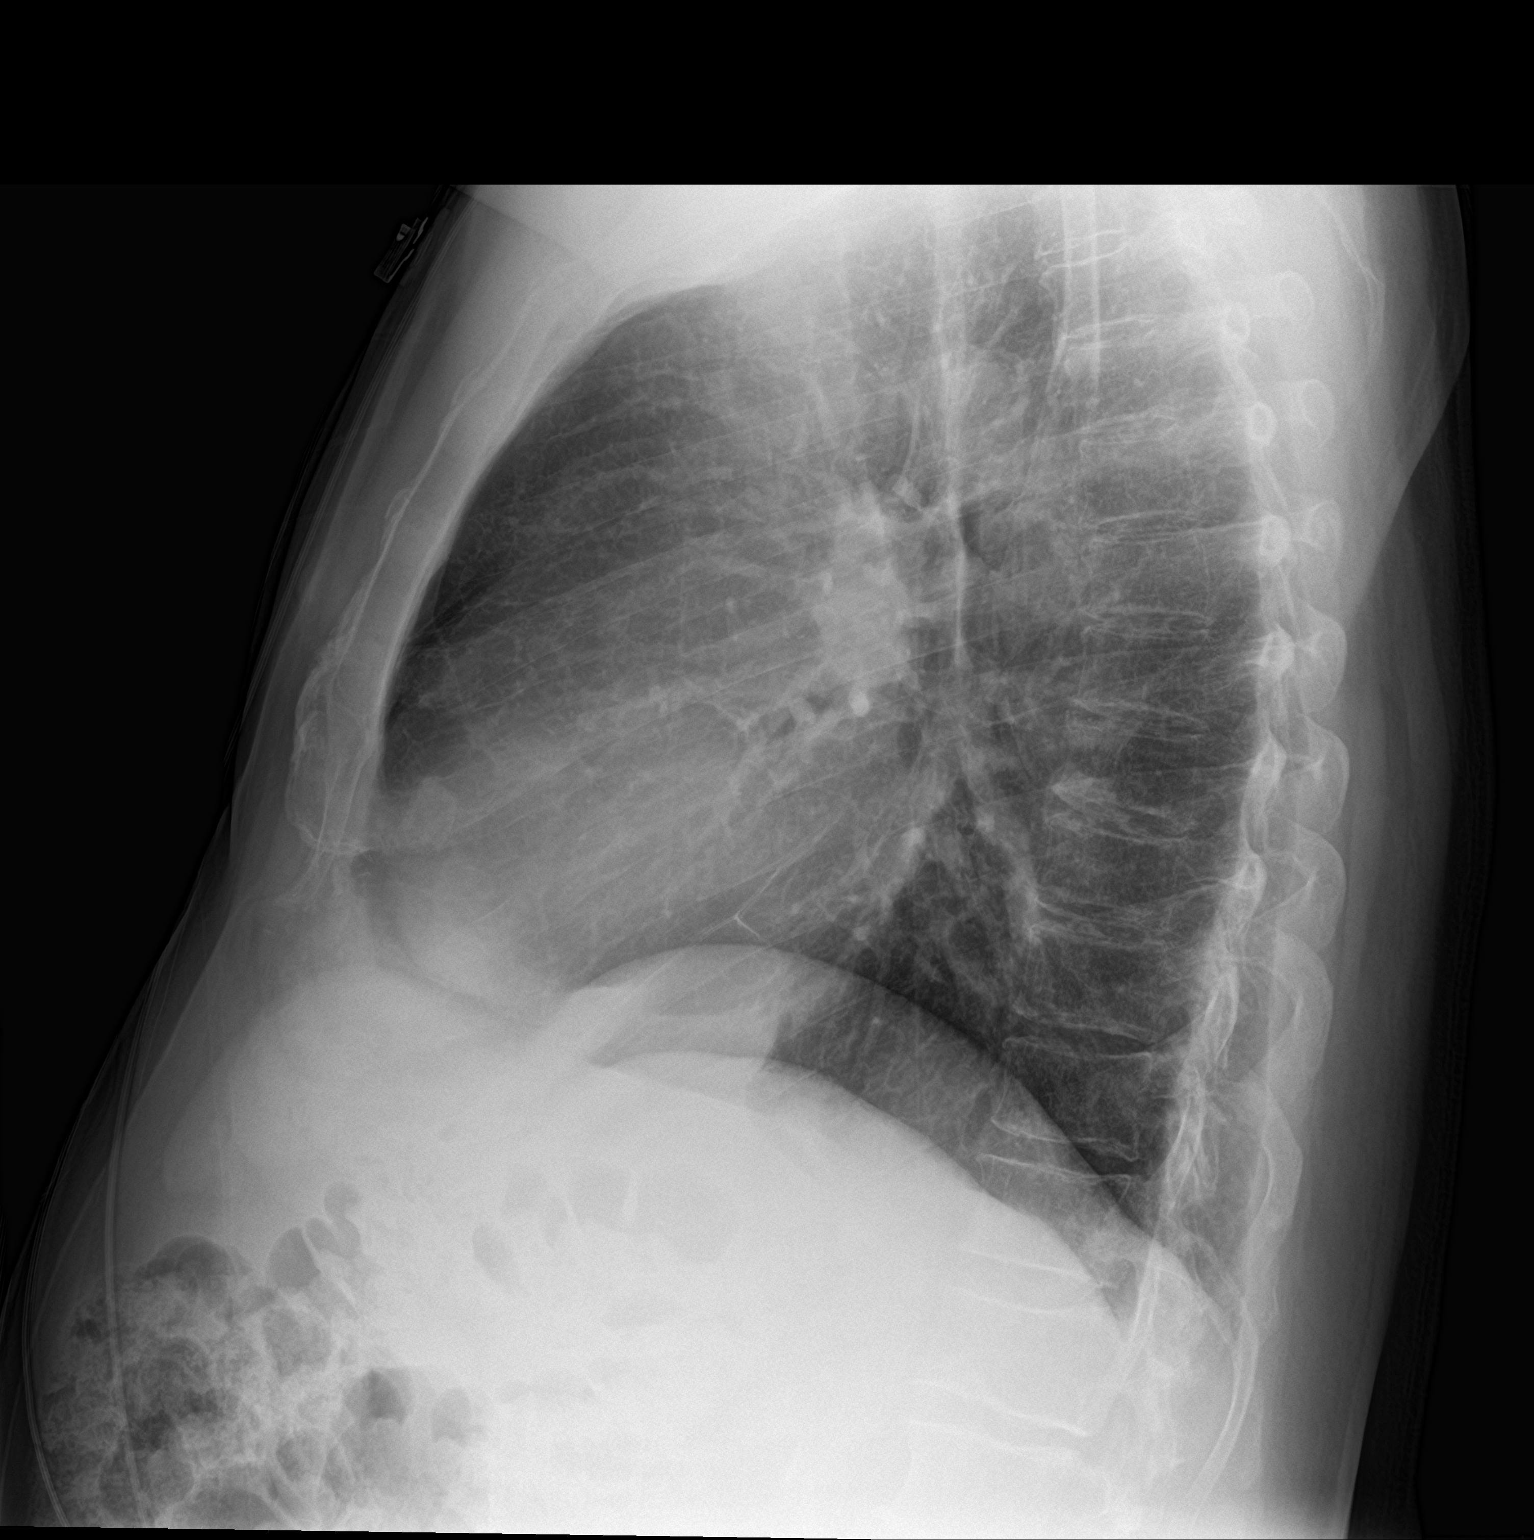

[2 of 2 positions shown; findings below may reference images not displayed]

FINDINGS: Normal heart size. Normal mediastinal contour. No pneumothorax. No
pleural effusion. Lungs appear clear, with no acute consolidative
airspace disease and no pulmonary edema.
IMPRESSION: No active cardiopulmonary disease.

## 2020-03-31 IMAGING — DX DG CHEST 1V PORT
1 series · 1 of 1 positions shown · non-contrast
Comparison: Radiograph May 25, 2017.

CLINICAL DATA: Status post coronary bypass graft.

EXAM:
PORTABLE CHEST 1 VIEW

[chest]
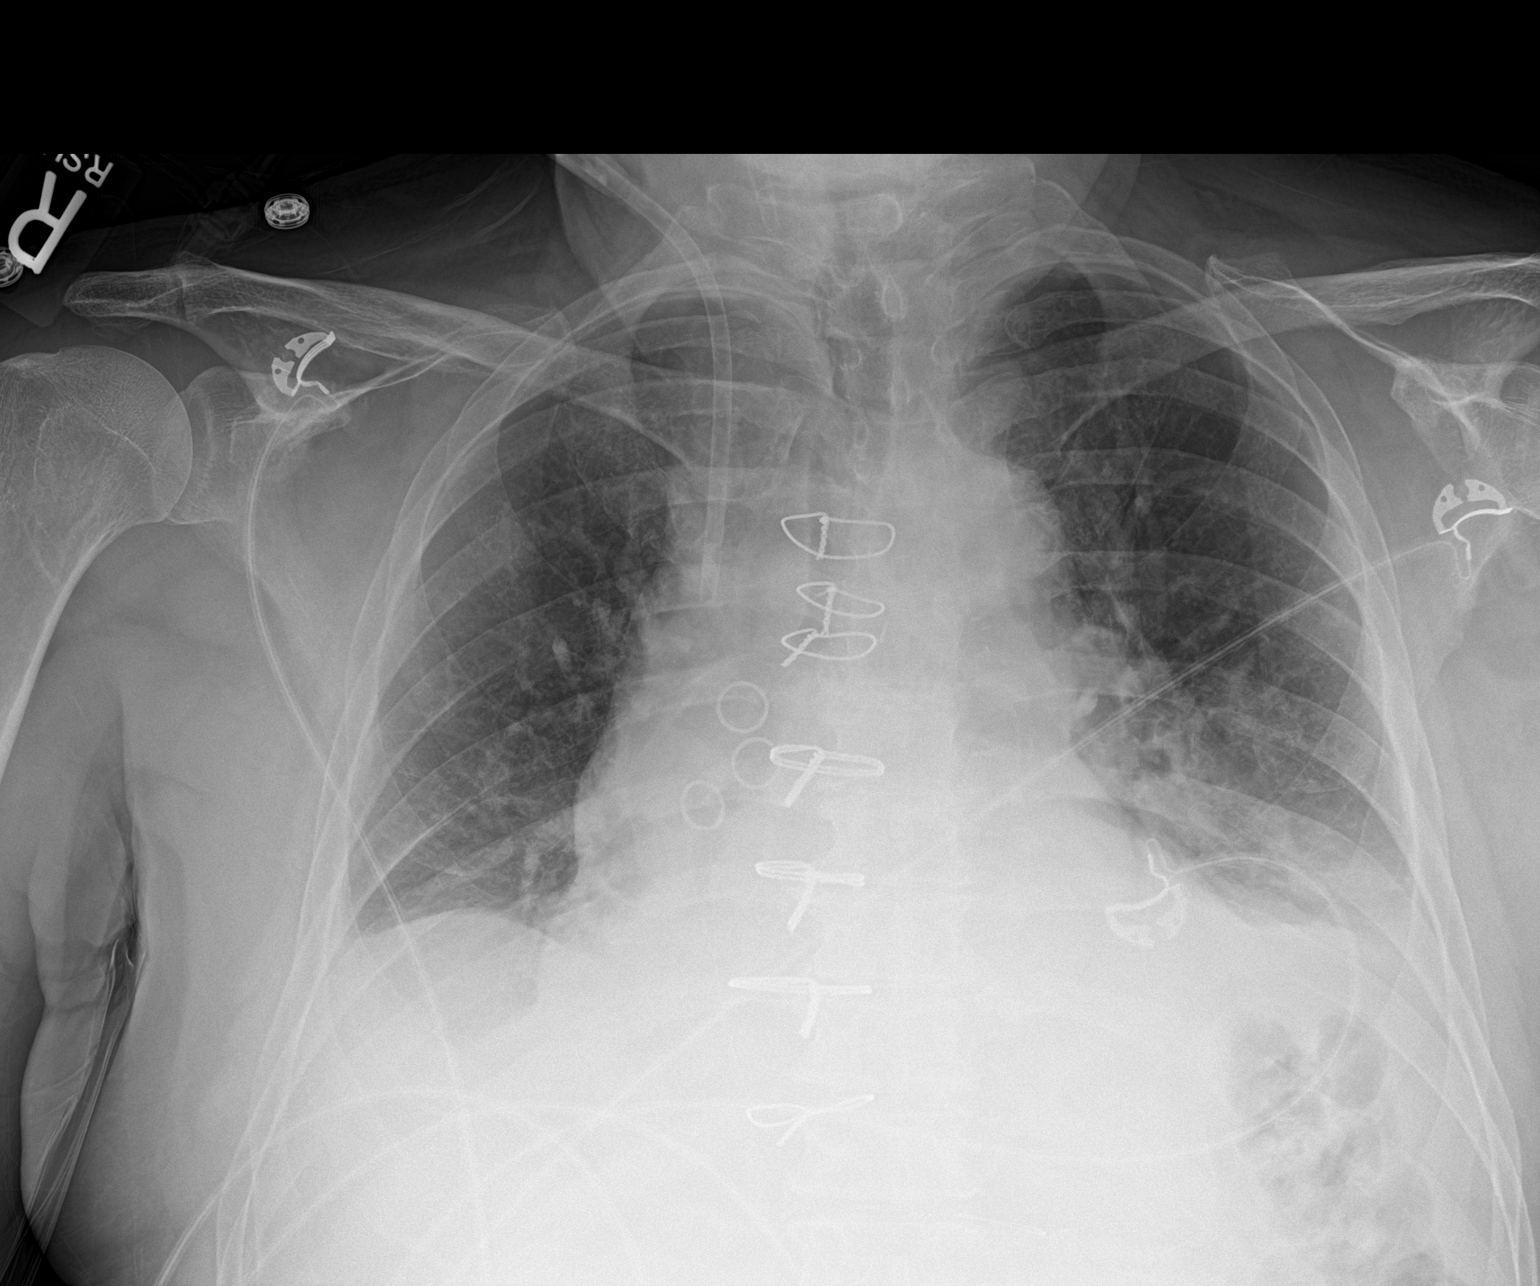

[1 of 1 positions shown; findings below may reference images not displayed]

FINDINGS: Stable cardiomegaly. Swan-Ganz catheter has been removed. Right
internal jugular venous sheath remains. No pneumothorax is noted.
Left-sided chest tube has been removed. Minimal right basilar
subsegmental atelectasis is noted. Stable left basilar atelectasis
is noted with probable pleural effusion. Bony thorax is
unremarkable.
IMPRESSION: Left-sided chest tube has been removed without pneumothorax. Stable
mild left basilar atelectasis with effusion is noted.

## 2020-04-06 ENCOUNTER — Other Ambulatory Visit: Payer: Self-pay | Admitting: Cardiology

## 2020-04-06 DIAGNOSIS — E7800 Pure hypercholesterolemia, unspecified: Secondary | ICD-10-CM

## 2020-05-01 DIAGNOSIS — I10 Essential (primary) hypertension: Secondary | ICD-10-CM | POA: Diagnosis not present

## 2020-05-01 DIAGNOSIS — I2510 Atherosclerotic heart disease of native coronary artery without angina pectoris: Secondary | ICD-10-CM | POA: Diagnosis not present

## 2020-05-01 DIAGNOSIS — E782 Mixed hyperlipidemia: Secondary | ICD-10-CM | POA: Diagnosis not present

## 2020-05-01 DIAGNOSIS — K219 Gastro-esophageal reflux disease without esophagitis: Secondary | ICD-10-CM | POA: Diagnosis not present

## 2020-05-13 DIAGNOSIS — G4733 Obstructive sleep apnea (adult) (pediatric): Secondary | ICD-10-CM | POA: Diagnosis not present

## 2020-06-02 DIAGNOSIS — K219 Gastro-esophageal reflux disease without esophagitis: Secondary | ICD-10-CM | POA: Diagnosis not present

## 2020-06-02 DIAGNOSIS — I2510 Atherosclerotic heart disease of native coronary artery without angina pectoris: Secondary | ICD-10-CM | POA: Diagnosis not present

## 2020-06-02 DIAGNOSIS — E782 Mixed hyperlipidemia: Secondary | ICD-10-CM | POA: Diagnosis not present

## 2020-06-02 DIAGNOSIS — I10 Essential (primary) hypertension: Secondary | ICD-10-CM | POA: Diagnosis not present

## 2020-07-27 DIAGNOSIS — I2510 Atherosclerotic heart disease of native coronary artery without angina pectoris: Secondary | ICD-10-CM | POA: Diagnosis not present

## 2020-07-27 DIAGNOSIS — E782 Mixed hyperlipidemia: Secondary | ICD-10-CM | POA: Diagnosis not present

## 2020-07-27 DIAGNOSIS — I10 Essential (primary) hypertension: Secondary | ICD-10-CM | POA: Diagnosis not present

## 2020-07-27 DIAGNOSIS — K219 Gastro-esophageal reflux disease without esophagitis: Secondary | ICD-10-CM | POA: Diagnosis not present

## 2020-07-29 ENCOUNTER — Other Ambulatory Visit: Payer: Self-pay | Admitting: Cardiology

## 2020-08-04 DIAGNOSIS — Z0000 Encounter for general adult medical examination without abnormal findings: Secondary | ICD-10-CM | POA: Diagnosis not present

## 2020-08-04 DIAGNOSIS — E559 Vitamin D deficiency, unspecified: Secondary | ICD-10-CM | POA: Diagnosis not present

## 2020-08-04 DIAGNOSIS — E782 Mixed hyperlipidemia: Secondary | ICD-10-CM | POA: Diagnosis not present

## 2020-08-04 DIAGNOSIS — G4730 Sleep apnea, unspecified: Secondary | ICD-10-CM | POA: Diagnosis not present

## 2020-08-04 DIAGNOSIS — Z1389 Encounter for screening for other disorder: Secondary | ICD-10-CM | POA: Diagnosis not present

## 2020-08-04 DIAGNOSIS — Z1211 Encounter for screening for malignant neoplasm of colon: Secondary | ICD-10-CM | POA: Diagnosis not present

## 2020-08-04 DIAGNOSIS — Z7189 Other specified counseling: Secondary | ICD-10-CM | POA: Diagnosis not present

## 2020-08-04 DIAGNOSIS — I2510 Atherosclerotic heart disease of native coronary artery without angina pectoris: Secondary | ICD-10-CM | POA: Diagnosis not present

## 2020-08-04 DIAGNOSIS — Z79899 Other long term (current) drug therapy: Secondary | ICD-10-CM | POA: Diagnosis not present

## 2020-08-04 DIAGNOSIS — I10 Essential (primary) hypertension: Secondary | ICD-10-CM | POA: Diagnosis not present

## 2020-08-12 DIAGNOSIS — G4733 Obstructive sleep apnea (adult) (pediatric): Secondary | ICD-10-CM | POA: Diagnosis not present

## 2020-09-24 DIAGNOSIS — I2510 Atherosclerotic heart disease of native coronary artery without angina pectoris: Secondary | ICD-10-CM | POA: Diagnosis not present

## 2020-09-24 DIAGNOSIS — K219 Gastro-esophageal reflux disease without esophagitis: Secondary | ICD-10-CM | POA: Diagnosis not present

## 2020-09-24 DIAGNOSIS — I10 Essential (primary) hypertension: Secondary | ICD-10-CM | POA: Diagnosis not present

## 2020-09-24 DIAGNOSIS — E782 Mixed hyperlipidemia: Secondary | ICD-10-CM | POA: Diagnosis not present

## 2020-10-06 DIAGNOSIS — I10 Essential (primary) hypertension: Secondary | ICD-10-CM | POA: Diagnosis not present

## 2020-10-06 DIAGNOSIS — Z23 Encounter for immunization: Secondary | ICD-10-CM | POA: Diagnosis not present

## 2020-10-09 ENCOUNTER — Telehealth: Payer: Self-pay | Admitting: Cardiology

## 2020-10-09 NOTE — Telephone Encounter (Signed)
Called and lmom pt to call us back  °

## 2020-10-09 NOTE — Telephone Encounter (Signed)
Pt c/o medication issue:  1. Name of Medication:   PRALUENT 75 MG/ML SOAJ    2. How are you currently taking this medication (dosage and times per day)? INJECT 1 PEN UNDER THE SKIN (SUBCUTANEOUS INJECTION) EVERY 14 DAYS  3. Are you having a reaction (difficulty breathing--STAT)? no  4. What is your medication issue? Pt states that copay has changed for this medication and he is also currently in the donut hole, pt states that his insurance has gone back to Rep[place] and he wanted to see if switching back would save him money.. please advise

## 2020-10-13 ENCOUNTER — Telehealth: Payer: Self-pay

## 2020-10-13 NOTE — Telephone Encounter (Signed)
Returned a call to the pt and stated that he needed to fill out patient assistance forms. And he voiced understanding

## 2020-10-13 NOTE — Telephone Encounter (Signed)
Pt is returning your call regarding Rx Praluent. Pt stated he could be reached on his mobile number ([phone]). Please address. Thank you

## 2020-10-14 NOTE — Telephone Encounter (Signed)
Pt was contacted and spoken to yesterday and this was all documented in that encounter and pt assistance forms were mailed.

## 2020-10-26 ENCOUNTER — Other Ambulatory Visit: Payer: Self-pay | Admitting: Cardiology

## 2020-10-27 ENCOUNTER — Other Ambulatory Visit: Payer: Self-pay

## 2020-10-27 ENCOUNTER — Ambulatory Visit: Payer: Medicare Other | Admitting: Cardiology

## 2020-10-27 ENCOUNTER — Encounter: Payer: Self-pay | Admitting: Cardiology

## 2020-10-27 VITALS — BP 156/80 | HR 67 | Ht 70.0 in | Wt 223.6 lb

## 2020-10-27 DIAGNOSIS — E7800 Pure hypercholesterolemia, unspecified: Secondary | ICD-10-CM

## 2020-10-27 DIAGNOSIS — I10 Essential (primary) hypertension: Secondary | ICD-10-CM

## 2020-10-27 DIAGNOSIS — I2583 Coronary atherosclerosis due to lipid rich plaque: Secondary | ICD-10-CM

## 2020-10-27 DIAGNOSIS — I2510 Atherosclerotic heart disease of native coronary artery without angina pectoris: Secondary | ICD-10-CM | POA: Diagnosis not present

## 2020-10-27 MED ORDER — EZETIMIBE 10 MG PO TABS: 10.0000 mg | ORAL_TABLET | Freq: Every day | ORAL | 3 refills | Status: DC

## 2020-10-27 MED ORDER — METOPROLOL TARTRATE 25 MG PO TABS: ORAL_TABLET | ORAL | 3 refills | Status: DC

## 2020-10-27 MED ORDER — PRALUENT 75 MG/ML ~~LOC~~ SOAJ
SUBCUTANEOUS | 3 refills | Status: DC
Start: 2020-10-27 — End: 2020-10-31

## 2020-10-27 NOTE — Addendum Note (Signed)
Addended by: Theresia Majors on: 10/27/2020 08:35 AM   Modules accepted: Orders

## 2020-10-27 NOTE — Progress Notes (Signed)
Cardiology Office Note:    Date:  10/27/2020   ID:  Tyrone Sherman, DOB Jul 20, 1950, MRN [id number]  PCP:  Marden Noble, MD  Cardiologist:  Armanda Magic, MD    Referring MD: Marden Noble, MD   Chief Complaint  Patient presents with   Coronary Artery Disease   Hypertension   Hyperlipidemia     History of Present Illness:    Tyrone Sherman is a 70 y.o. male with a hx of ASCAD, HTN and hyperlipidemia. He presented for evaluation of CP and stress test c/w ischemia and patient underwent cath showing severe multivessel CAD with normal LVF and underwent CABG 05/24/2017 (LIMA to the LAD, sequential reverse saphenous vein graft to the acute marginal and posterior descending branches of the RCA, sequential reverse saphenous vein graft to the first and second obtuse marginal, reverse saphenous vein graft to the diagonal).    He is here today for followup and is doing well.  He denies any chest pain or pressure, SOB, DOE, PND, orthopnea, LE edema, dizziness, palpitations or syncope. He is compliant with his meds and is tolerating meds with no SE.    Past Medical History:  Diagnosis Date   Allergic rhinitis    Benign essential hypertension    Chest tightness    EXERTIONAL   DJD (degenerative joint disease)    GERD (gastroesophageal reflux disease)    Hyperlipidemia    Hypertension    Metatarsal fracture 2012   LEFT FOOT   Obstructive sleep apnea on CPAP    Onychomycosis    Trigger thumb of right hand    Ventral hernia    Ventral hernia     Past Surgical History:  Procedure Laterality Date   CORONARY ARTERY BYPASS GRAFT N/A 05/24/2017   Procedure: CORONARY ARTERY BYPASS GRAFTING (CABG);  Surgeon: Delight Ovens, MD;  Location: [facility] OR;  Service: Open Heart Surgery;  Laterality: N/A;  Times 6 using left internal mammary artery to LAD and endoscopically harvested right saphenous vein to Diagonal, OM1, OM2, Acute Marginal, and PDA   Incarcerated umbilical hernia repair     LEFT HEART  CATH AND CORONARY ANGIOGRAPHY N/A 05/20/2017   Procedure: LEFT HEART CATH AND CORONARY ANGIOGRAPHY;  Surgeon: Corky Crafts, MD;  Location: [facility] INVASIVE CV LAB;  Service: Cardiovascular;  Laterality: N/A;   Left metatarsal fracture repair     Left varicocele repair 1973     TEE WITHOUT CARDIOVERSION N/A 05/24/2017   Procedure: TRANSESOPHAGEAL ECHOCARDIOGRAM (TEE);  Surgeon: Delight Ovens, MD;  Location: [facility] OR;  Service: Open Heart Surgery;  Laterality: N/A;    Current Medications: Current Meds  Medication Sig   acetaminophen (TYLENOL) 325 MG tablet Take 2 tablets (650 mg total) by mouth every 6 (six) hours as needed for mild pain.   ALPRAZolam (XANAX) 0.25 MG tablet Take 0.25 mg by mouth at bedtime as needed for anxiety.   Ascorbic Acid (VITAMIN C) 100 MG tablet Take 100 mg by mouth daily.   aspirin EC 81 MG tablet Take 81 mg by mouth daily.   Cholecalciferol (VITAMIN D3) 2000 units TABS Take 2,000 Units by mouth daily.   citalopram (CELEXA) 40 MG tablet Take 40 mg by mouth daily.   ezetimibe (ZETIA) 10 MG tablet Take 1 tablet by mouth once daily   folic acid (FOLVITE) 1 MG tablet Take 1 mg by mouth daily.   Garlic 500 MG CAPS Take 500 mg by mouth.   hydrochlorothiazide (MICROZIDE) 12.5 MG capsule Take 1  capsule by mouth once daily   metoprolol tartrate (LOPRESSOR) 25 MG tablet Take 1/2 (one-half) tablet by mouth twice daily   nitroGLYCERIN (NITROSTAT) 0.4 MG SL tablet 1 tablet sublingually Every 5 minutes as needed for chest pain not relieved with rest 30 days   Omega 3 1200 MG CAPS Take 1,200 mg by mouth daily.   omeprazole (PRILOSEC) 20 MG capsule Take 20 mg by mouth 2 (two) times daily before a meal.   PRALUENT 75 MG/ML SOAJ INJECT 1 PEN UNDER THE SKIN (SUBCUTANEOUS INJECTION)  EVERY 14 DAYS   rosuvastatin (CRESTOR) 40 MG tablet Take 0.5 tablets (20 mg total) by mouth daily.   valsartan (DIOVAN) 80 MG tablet Take 80 mg by mouth daily.   vitamin E 100 UNIT capsule Take 100 Units  by mouth daily.      Allergies:   Lipitor [atorvastatin calcium], Norvasc [amlodipine besylate], and Prinivil [lisinopril]   Social History   Socioeconomic History   Marital status: Married    Spouse name: Not on file   Number of children: Not on file   Years of education: Not on file   Highest education level: Not on file  Occupational History   Occupation: Building control surveyor  Tobacco Use   Smoking status: Never   Smokeless tobacco: Never  Vaping Use   Vaping Use: Never used  Substance and Sexual Activity   Alcohol use: Never   Drug use: Never   Sexual activity: Yes  Other Topics Concern   Not on file  Social History Narrative   Not on file   Social Determinants of Health   Financial Resource Strain: Not on file  Food Insecurity: Not on file  Transportation Needs: Not on file  Physical Activity: Not on file  Stress: Not on file  Social Connections: Not on file     Family History: The patient's family history includes CAD in his brother, brother, brother, father, and mother; Hypertension (age of onset: 73) in his brother; Hypertension (age of onset: 29) in his brother; Hypertension (age of onset: 1) in his brother; Other in his father and mother.  ROS:   Please see the history of present illness.    ROS  All other systems reviewed and negative.   EKGs/Labs/Other Studies Reviewed:    The following studies were reviewed today: EKG  EKG:  EKG is  ordered today.  The ekg ordered today demonstrates NSR with nonspecific T wave abnormality and low voltage QRS in limb leads  Recent Labs: No results found for requested labs within last 8760 hours.   Recent Lipid Panel    Component Value Date/Time   CHOL 86 (L) 09/11/2018 0852   TRIG 69 09/11/2018 0852   HDL 43 09/11/2018 0852   CHOLHDL 2.0 09/11/2018 0852   CHOLHDL 4.6 05/21/2017 0602   VLDL 17 05/21/2017 0602   LDLCALC 29 09/11/2018 0852    Physical Exam:    VS:  BP (!) 156/80   Pulse 67   Ht 5\' 10"   (1.778 m)   Wt 223 lb 9.6 oz (101.4 kg)   SpO2 97%   BMI 32.08 kg/m     Wt Readings from Last 3 Encounters:  10/27/20 223 lb 9.6 oz (101.4 kg)  09/20/19 213 lb 6.4 oz (96.8 kg)  09/04/18 204 lb (92.5 kg)    GEN: Well nourished, well developed in no acute distress HEENT: Normal NECK: No JVD; No carotid bruits LYMPHATICS: No lymphadenopathy CARDIAC:RRR, no murmurs, rubs, gallops RESPIRATORY:  Clear to  auscultation without rales, wheezing or rhonchi  ABDOMEN: Soft, non-tender, non-distended MUSCULOSKELETAL:  No edema; No deformity  SKIN: Warm and dry NEUROLOGIC:  Alert and oriented x 3 PSYCHIATRIC:  Normal affect   ASSESSMENT:    1. Coronary artery disease due to lipid rich plaque   2. Essential hypertension   3. Pure hypercholesterolemia    PLAN:    In order of problems listed above:  1.  ASCAD -cath 2019 showed severe multivessel CAD with normal LVF and underwent CABG 05/24/2017 (LIMA to the LAD, sequential reverse saphenous vein graft to the acute marginal and posterior descending branches of the RCA, sequential reverse saphenous vein graft to the first and second obtuse marginal, reverse saphenous vein graft to the diagonal -he denies any anginal symptoms -continue ASA 81mg  daily, BB and statin  2.  HTN -BP borderline controlled on exam today>just took meds -continue prescription drug management with HCTZ 12.5mg  daily, Valsartan 80mg  daily and Lopressor 12.5mg  BID>refilled -I have personally reviewed and interpreted outside labs performed by patient's PCP which showed SCr 0.76 and K+ 4.3  in July 2022 -I have asked him to check his BP daily for a week and call with results  3.  HLD -LDL goal < 70 I have personally reviewed and interpreted outside labs performed by patient's PCP which showed LDL 11, HDL 46, TAGS 104 and ALT 41 in July 2022  -continue prescription drug management with Praluent and Crestor 20mg  daily and Zetia 10mg  daily>refilled  Medication  Adjustments/Labs and Tests Ordered: Current medicines are reviewed at length with the patient today.  Concerns regarding medicines are outlined above.  No orders of the defined types were placed in this encounter.  No orders of the defined types were placed in this encounter.   Signed, August 2022, MD  10/27/2020 8:28 AM    [facility]

## 2020-10-27 NOTE — Patient Instructions (Signed)
Dr. Mayford Knife would like for you to take your blood pressure daily for one week and send a MyChart message with a list of your readings.   Medication Instructions:  Your physician recommends that you continue on your current medications as directed. Please refer to the Current Medication list given to you today.  *If you need a refill on your cardiac medications before your next appointment, please call your pharmacy*   Follow-Up: At [facility], you and your health needs are our priority.  As part of our continuing mission to provide you with exceptional heart care, we have created designated Provider Care Teams.  These Care Teams include your primary Cardiologist (physician) and Advanced Practice Providers (APPs -  Physician Assistants and Nurse Practitioners) who all work together to provide you with the care you need, when you need it.   Your next appointment:   1 year(s)  The format for your next appointment:   In Person  Provider:   You may see Armanda Magic, MD or one of the following Advanced Practice Providers on your designated Care Team:   Ronie Spies, PA-C Jacolyn Reedy, PA-C

## 2020-10-29 ENCOUNTER — Telehealth: Payer: Self-pay | Admitting: Pharmacist

## 2020-10-29 NOTE — Telephone Encounter (Signed)
Patient denied patient assistance through MyPral[employer]. No other pt assistance options. Patient should continue zetia and rosuvastatin and resume Praluent come January. Need to confirm if patient is taking rosuvastatin 20 or 40mg . If he decreased to 20mg  only bc of his LDL on Praluent, he should increase to 40mg  while off Praluent.

## 2020-10-31 ENCOUNTER — Other Ambulatory Visit: Payer: Self-pay | Admitting: Pharmacist

## 2020-10-31 DIAGNOSIS — E7800 Pure hypercholesterolemia, unspecified: Secondary | ICD-10-CM

## 2020-10-31 MED ORDER — PRALUENT 75 MG/ML ~~LOC~~ SOAJ: SUBCUTANEOUS | 3 refills | Status: DC

## 2020-11-12 DIAGNOSIS — G4733 Obstructive sleep apnea (adult) (pediatric): Secondary | ICD-10-CM | POA: Diagnosis not present

## 2020-11-24 DIAGNOSIS — I2510 Atherosclerotic heart disease of native coronary artery without angina pectoris: Secondary | ICD-10-CM | POA: Diagnosis not present

## 2020-11-24 DIAGNOSIS — I10 Essential (primary) hypertension: Secondary | ICD-10-CM | POA: Diagnosis not present

## 2020-11-24 DIAGNOSIS — E782 Mixed hyperlipidemia: Secondary | ICD-10-CM | POA: Diagnosis not present

## 2020-11-24 DIAGNOSIS — K219 Gastro-esophageal reflux disease without esophagitis: Secondary | ICD-10-CM | POA: Diagnosis not present

## 2020-11-25 ENCOUNTER — Telehealth: Payer: Self-pay | Admitting: Pharmacist

## 2020-11-25 NOTE — Telephone Encounter (Signed)
Called pt and left message. Will plan to enroll him in [facility] grant to assist with Praluent copay. Previously denied for pt assistance through [employer].

## 2020-11-26 NOTE — Telephone Encounter (Signed)
Called and spoke to [facility] gave copay info Pharmacy Card CARD NO. [id number]   CARD STATUS [place]   BIN [id number]   PCN [id number]   PC GROUP [id number]   HELP DESK [phone]   PROVIDER PDMI   PROCESSOR PDMI

## 2020-11-26 NOTE — Telephone Encounter (Signed)
Patient is interested in resuming Praluent and being enrolled in healthwell  His SSN is [id number]  [facility] please enroll him, call his pharmacy with the information and let patient know when compete.

## 2021-02-12 DIAGNOSIS — G4733 Obstructive sleep apnea (adult) (pediatric): Secondary | ICD-10-CM | POA: Diagnosis not present

## 2021-02-17 DIAGNOSIS — E782 Mixed hyperlipidemia: Secondary | ICD-10-CM | POA: Diagnosis not present

## 2021-02-17 DIAGNOSIS — K219 Gastro-esophageal reflux disease without esophagitis: Secondary | ICD-10-CM | POA: Diagnosis not present

## 2021-02-17 DIAGNOSIS — I10 Essential (primary) hypertension: Secondary | ICD-10-CM | POA: Diagnosis not present

## 2021-05-06 DIAGNOSIS — K219 Gastro-esophageal reflux disease without esophagitis: Secondary | ICD-10-CM | POA: Diagnosis not present

## 2021-05-06 DIAGNOSIS — I2510 Atherosclerotic heart disease of native coronary artery without angina pectoris: Secondary | ICD-10-CM | POA: Diagnosis not present

## 2021-05-06 DIAGNOSIS — E782 Mixed hyperlipidemia: Secondary | ICD-10-CM | POA: Diagnosis not present

## 2021-05-06 DIAGNOSIS — I10 Essential (primary) hypertension: Secondary | ICD-10-CM | POA: Diagnosis not present

## 2021-05-13 DIAGNOSIS — G4733 Obstructive sleep apnea (adult) (pediatric): Secondary | ICD-10-CM | POA: Diagnosis not present

## 2021-08-13 DIAGNOSIS — G4733 Obstructive sleep apnea (adult) (pediatric): Secondary | ICD-10-CM | POA: Diagnosis not present

## 2021-08-17 DIAGNOSIS — Z0000 Encounter for general adult medical examination without abnormal findings: Secondary | ICD-10-CM | POA: Diagnosis not present

## 2021-08-17 DIAGNOSIS — G4730 Sleep apnea, unspecified: Secondary | ICD-10-CM | POA: Diagnosis not present

## 2021-08-17 DIAGNOSIS — Z1211 Encounter for screening for malignant neoplasm of colon: Secondary | ICD-10-CM | POA: Diagnosis not present

## 2021-08-17 DIAGNOSIS — E559 Vitamin D deficiency, unspecified: Secondary | ICD-10-CM | POA: Diagnosis not present

## 2021-08-17 DIAGNOSIS — I2510 Atherosclerotic heart disease of native coronary artery without angina pectoris: Secondary | ICD-10-CM | POA: Diagnosis not present

## 2021-08-17 DIAGNOSIS — K219 Gastro-esophageal reflux disease without esophagitis: Secondary | ICD-10-CM | POA: Diagnosis not present

## 2021-08-17 DIAGNOSIS — E782 Mixed hyperlipidemia: Secondary | ICD-10-CM | POA: Diagnosis not present

## 2021-08-17 DIAGNOSIS — I10 Essential (primary) hypertension: Secondary | ICD-10-CM | POA: Diagnosis not present

## 2021-08-19 ENCOUNTER — Other Ambulatory Visit: Payer: Self-pay

## 2021-08-19 MED ORDER — EZETIMIBE 10 MG PO TABS: 10.0000 mg | ORAL_TABLET | Freq: Every day | ORAL | 0 refills | Status: DC

## 2021-09-16 ENCOUNTER — Ambulatory Visit: Payer: Self-pay

## 2021-09-16 NOTE — Patient Outreach (Signed)
  Care Coordination   09/16/2021 Name: Tyrone Sherman MRN: [id number] DOB: August 18, 1950   Care Coordination Outreach Attempts:  An unsuccessful telephone outreach was attempted today to offer the patient information about available care coordination services as a benefit of their health plan.   Follow Up Plan:  Additional outreach attempts will be made to offer the patient care coordination information and services.   Encounter Outcome:  No Answer  Care Coordination Interventions Activated:  No   Care Coordination Interventions:  No, not indicated    [facility] Care Management [phone]

## 2021-11-07 ENCOUNTER — Other Ambulatory Visit: Payer: Self-pay | Admitting: Cardiology

## 2021-11-10 ENCOUNTER — Other Ambulatory Visit: Payer: Self-pay | Admitting: Cardiology

## 2021-11-10 ENCOUNTER — Other Ambulatory Visit: Payer: Self-pay | Admitting: Pharmacist

## 2021-11-10 DIAGNOSIS — E7800 Pure hypercholesterolemia, unspecified: Secondary | ICD-10-CM

## 2021-11-10 DIAGNOSIS — I2510 Atherosclerotic heart disease of native coronary artery without angina pectoris: Secondary | ICD-10-CM

## 2021-11-10 MED ORDER — PRALUENT 75 MG/ML ~~LOC~~ SOAJ
SUBCUTANEOUS | 0 refills | Status: DC
Start: 2021-11-10 — End: 2022-11-15

## 2021-11-13 DIAGNOSIS — G4733 Obstructive sleep apnea (adult) (pediatric): Secondary | ICD-10-CM | POA: Diagnosis not present

## 2021-11-18 DIAGNOSIS — I2510 Atherosclerotic heart disease of native coronary artery without angina pectoris: Secondary | ICD-10-CM | POA: Diagnosis not present

## 2021-11-18 DIAGNOSIS — I10 Essential (primary) hypertension: Secondary | ICD-10-CM | POA: Diagnosis not present

## 2021-11-18 DIAGNOSIS — E782 Mixed hyperlipidemia: Secondary | ICD-10-CM | POA: Diagnosis not present

## 2021-12-03 NOTE — Progress Notes (Signed)
Cardiology Office Note:    Date:  12/04/2021   ID:  Tyrone FitchBenton C Takeda, DOB 03/28/1950, MRN [id number]  PCP:  Marden NobleGates, Robert, MD   [facility] Providers Cardiologist:  Armanda Magicraci Turner, MD     Referring MD: Marden NobleGates, Robert, MD   Chief Complaint: annual follow-up CAD s/p CABG  History of Present Illness:    Tyrone Sherman is a very pleasant 71 y.o. male with a hx of CAD s/p CABG, HTN, HLD, GERD, and OSA on CPAP.  He presented to cardiology for evaluation of chest pain and had stress test c/w ischemia.  He underwent cardiac catheterization which showed severe multivessel CAD with normal LVEF and underwent CABG 05/24/2017 (LIMA to LAD, sequential reverse saphenous vein graft to acute marginal and posterior descending branches of the RCA, sequential reverse saphenous vein graft to first and second obtuse marginal, and to diagonal).  He has maintained consistent follow-up.  Last cardiology clinic visit was 10/27/2020 with Dr. Mayford Knifeurner at which time BP was slightly elevated.  He was asked to monitor for a week and call back with results.  Home BP readings were well controlled and no medication changes were made.  Today, he is here for follow-up. Is feeling well, no cardiac concerns. Has retired, helps his son by running errands and does most of the house and yard work because his wife had a stroke. [place] to [place] and was coughing afterwards. Cough is improving, thinks it was the dry air in [place]. He denies chest pain, shortness of breath, lower extremity edema, fatigue, palpitations, melena, hematuria, hemoptysis, diaphoresis, weakness, presyncope, syncope, orthopnea, and PND. Does most of his own cooking, rarely eats processed foods.   Past Medical History:  Diagnosis Date   Allergic rhinitis    Benign essential hypertension    Chest tightness    EXERTIONAL   DJD (degenerative joint disease)    GERD (gastroesophageal reflux disease)    Hyperlipidemia    Hypertension    Metatarsal fracture 2012    LEFT FOOT   Obstructive sleep apnea on CPAP    Onychomycosis    Trigger thumb of right hand    Ventral hernia    Ventral hernia     Past Surgical History:  Procedure Laterality Date   CORONARY ARTERY BYPASS GRAFT N/A 05/24/2017   Procedure: CORONARY ARTERY BYPASS GRAFTING (CABG);  Surgeon: Delight OvensGerhardt, Edward B, MD;  [facility];  Service: Open Heart Surgery;  Laterality: N/A;  Times 6 using left internal mammary artery to LAD and endoscopically harvested right saphenous vein to Diagonal, OM1, OM2, Acute Marginal, and PDA   Incarcerated umbilical hernia repair     LEFT HEART CATH AND CORONARY ANGIOGRAPHY N/A 05/20/2017   Procedure: LEFT HEART CATH AND CORONARY ANGIOGRAPHY;  Surgeon: Corky CraftsVaranasi, Jayadeep S, MD;  [facility];  Service: Cardiovascular;  Laterality: N/A;   Left metatarsal fracture repair     Left varicocele repair 1973     TEE WITHOUT CARDIOVERSION N/A 05/24/2017   Procedure: TRANSESOPHAGEAL ECHOCARDIOGRAM (TEE);  Surgeon: Delight OvensGerhardt, Edward B, MD;  [facility];  Service: Open Heart Surgery;  Laterality: N/A;    Current Medications: Current Meds  Medication Sig   acetaminophen (TYLENOL) 325 MG tablet Take 2 tablets (650 mg total) by mouth every 6 (six) hours as needed for mild pain.   Alirocumab (PRALUENT) 75 MG/ML SOAJ INJECT 1  SUBCUTANEOUSLY ONCE EVERY 14 DAYS   ALPRAZolam (XANAX) 0.25 MG tablet Take 0.25 mg by mouth at  bedtime as needed for anxiety.   Ascorbic Acid (VITAMIN C) 100 MG tablet Take 100 mg by mouth daily.   aspirin EC 81 MG tablet Take 81 mg by mouth daily.   Cholecalciferol (VITAMIN D3) 2000 units TABS Take 2,000 Units by mouth daily.   citalopram (CELEXA) 40 MG tablet Take 40 mg by mouth daily.   ezetimibe (ZETIA) 10 MG tablet Take 1 tablet (10 mg total) by mouth daily. Make follow up appt to request further refills. [phone]. 1st attempt.   folic acid (FOLVITE) 1 MG tablet Take 1 mg by mouth daily.   Garlic 500 MG CAPS Take 500 mg by  mouth.   Omega 3 1200 MG CAPS Take 1,200 mg by mouth daily.   omeprazole (PRILOSEC) 20 MG capsule Take 20 mg by mouth 2 (two) times daily before a meal.   rosuvastatin (CRESTOR) 10 MG tablet Take 10 mg by mouth daily.   valsartan-hydrochlorothiazide (DIOVAN-HCT) 80-12.5 MG tablet Take 1 tablet by mouth daily.   vitamin E 100 UNIT capsule Take 100 Units by mouth daily.    [DISCONTINUED] hydrochlorothiazide (MICROZIDE) 12.5 MG capsule Take 1 capsule by mouth once daily   [DISCONTINUED] metoprolol tartrate (LOPRESSOR) 25 MG tablet Take 1/2 (one-half) tablet by mouth twice daily.   [DISCONTINUED] nitroGLYCERIN (NITROSTAT) 0.4 MG SL tablet 1 tablet sublingually Every 5 minutes as needed for chest pain not relieved with rest 30 days   [DISCONTINUED] rosuvastatin (CRESTOR) 40 MG tablet Take 0.5 tablets (20 mg total) by mouth daily.   [DISCONTINUED] valsartan (DIOVAN) 80 MG tablet Take 80 mg by mouth daily.     Allergies:   Lipitor [atorvastatin calcium], Norvasc [amlodipine besylate], and Prinivil [lisinopril]   Social History   Socioeconomic History   Marital status: Married    Spouse name: Not on file   Number of children: Not on file   Years of education: Not on file   Highest education level: Not on file  Occupational History   Occupation: Building control surveyor  Tobacco Use   Smoking status: Never   Smokeless tobacco: Never  Vaping Use   Vaping Use: Never used  Substance and Sexual Activity   Alcohol use: Never   Drug use: Never   Sexual activity: Yes  Other Topics Concern   Not on file  Social History Narrative   Not on file   Social Determinants of Health   Financial Resource Strain: Not on file  Food Insecurity: Not on file  Transportation Needs: Not on file  Physical Activity: Not on file  Stress: Not on file  Social Connections: Not on file     Family History: The patient's family history includes CAD in his brother, brother, brother, father, and mother; Hypertension  (age of onset: 29) in his brother; Hypertension (age of onset: 49) in his brother; Hypertension (age of onset: 45) in his brother; Other in his father and mother.  ROS:   Please see the history of present illness.  All other systems reviewed and are negative.  Labs/Other Studies Reviewed:    The following studies were reviewed today:  LHC 05/20/2017  Prox LAD lesion is 80% stenosed. Ost 1st Diag lesion is 80% stenosed. Mid Cx lesion is 95% stenosed. Ost 2nd Mrg lesion is 70% stenosed. Mid RCA lesion is 90% stenosed. Post Atrio lesion is 90% stenosed. The left ventricular ejection fraction is 55-65% by visual estimate. The left ventricular systolic function is normal. LV end diastolic pressure is normal. There is no aortic valve stenosis.  Severe three vessel CAD.  Given accelerating symptoms and angina at rest, will admit for inpatient TCTS consult.  Start heparin 8 hours post sheath pull.    Echo 05/21/2017  Left ventricle: The cavity size was normal. Wall thickness was    increased in a pattern of mild LVH. Systolic function was normal.    The estimated ejection fraction was in the range of 60% to 65%.    Wall motion was normal; there were no regional wall motion    abnormalities. Doppler parameters are consistent with abnormal    left ventricular relaxation (grade 1 diastolic dysfunction).    Indeterminate filling pressures.  - Mitral valve: There was mild regurgitation.  - Right ventricle: The cavity size was mildly dilated. Wall    thickness was normal.  - Atrial septum: No defect or patent foramen ovale was identified.   Recent Labs: No results found for requested labs within last 365 days.  Recent Lipid Panel    Component Value Date/Time   CHOL 86 (L) 09/11/2018 0852   TRIG 69 09/11/2018 0852   HDL 43 09/11/2018 0852   CHOLHDL 2.0 09/11/2018 0852   CHOLHDL 4.6 05/21/2017 0602   VLDL 17 05/21/2017 0602   LDLCALC 29 09/11/2018 0852     Risk  Assessment/Calculations:         Physical Exam:    VS:  BP 118/60   Pulse 70   Ht 5\' 10"  (1.778 m)   Wt 224 lb (101.6 kg)   SpO2 96%   BMI 32.14 kg/m     Wt Readings from Last 3 Encounters:  12/04/21 224 lb (101.6 kg)  10/27/20 223 lb 9.6 oz (101.4 kg)  09/20/19 213 lb 6.4 oz (96.8 kg)     GEN:  Well nourished, well developed in no acute distress HEENT: Normal NECK: No JVD; No carotid bruits CARDIAC: RRR, no murmurs, rubs, gallops RESPIRATORY:  Clear to auscultation without rales, wheezing or rhonchi  ABDOMEN: Soft, non-tender, non-distended MUSCULOSKELETAL:  No edema; No deformity. 2+ pedal pulses, equal bilaterally SKIN: Warm and dry NEUROLOGIC:  Alert and oriented x 3 PSYCHIATRIC:  Normal affect   EKG:  EKG is ordered today.  The ekg ordered today demonstrates normal sinus rhythm at 70 bpm, LAD, nonspecific T wave abnormality, no acute change from previous tracing   Diagnoses:    1. Pure hypercholesterolemia   2. Coronary artery disease due to lipid rich plaque   3. Essential hypertension   4. Obstructive sleep apnea on CPAP    Assessment and Plan:     CAD without angina: s/p CABG 2019 as outlined above. He denies chest pain, dyspnea, or other symptoms concerning for angina. No indication for further ischemic evaluation at this time.  No bleeding problems.  Continue aspirin, rosuvastatin, ezetimibe, Diovan HCTZ, metoprolol, Praluent.  Encouraged 150 minutes of moderate intensity exercise each week, heart healthy, mostly plant-based diet.  Hyperlipidemia LDL goal < 70: LDL 11 on 08/17/21. Message sent to Pharm D for question about cost of Praluent.   Hypertension: BP is well-controlled. No medication changes today.   OSA: Compliant with CPAP, no concerns.     Disposition: 1 year with Dr. 08/19/21  Medication Adjustments/Labs and Tests Ordered: Current medicines are reviewed at length with the patient today.  Concerns regarding medicines are outlined above.   Orders Placed This Encounter  Procedures   EKG 12-Lead   Meds ordered this encounter  Medications   metoprolol tartrate (LOPRESSOR) 25 MG tablet  Sig: Take 1/2 (one-half) tablet by mouth twice daily.    Dispense:  90 tablet    Refill:  3   nitroGLYCERIN (NITROSTAT) 0.4 MG SL tablet    Sig: 1 tablet sublingually Every 5 minutes as needed for chest pain not relieved with rest 30 days    Dispense:  25 tablet    Refill:  3    Patient Instructions  Medication Instructions:   Your physician recommends that you continue on your current medications as directed. Please refer to the Current Medication list given to you today.   *If you need a refill on your cardiac medications before your next appointment, please call your pharmacy*   Lab Work:  None ordered.  If you have labs (blood work) drawn today and your tests are completely normal, you will receive your results only by: MyChart Message (if you have MyChart) OR A paper copy in the mail If you have any lab test that is abnormal or we need to change your treatment, we will call you to review the results.   Testing/Procedures:  None ordered.   Follow-Up: At [facility], you and your health needs are our priority.  As part of our continuing mission to provide you with exceptional heart care, we have created designated Provider Care Teams.  These Care Teams include your primary Cardiologist (physician) and Advanced Practice Providers (APPs -  Physician Assistants and Nurse Practitioners) who all work together to provide you with the care you need, when you need it.  We recommend signing up for the patient portal called "MyChart".  Sign up information is provided on this After Visit Summary.  MyChart is used to connect with patients for Virtual Visits (Telemedicine).  Patients are able to view lab/test results, encounter notes, upcoming appointments, etc.  Non-urgent messages can be sent to your provider as well.   To  learn more about what you can do with MyChart, go to [email or web address].    Your next appointment:   1 year(s)  The format for your next appointment:   In Person  Provider:   Armanda Magic, MD     Other Instructions  Your physician wants you to follow-up in: 1 year with Dr. Mayford Knife.  You will receive a reminder letter in the mail two months in advance. If you don't receive a letter, please call our office to schedule the follow-up appointment.   Important Information About Sugar         Signed, Levi Aland, NP  12/04/2021 12:05 PM    [facility]

## 2021-12-04 ENCOUNTER — Ambulatory Visit: Payer: Medicare Other | Attending: Nurse Practitioner | Admitting: Nurse Practitioner

## 2021-12-04 ENCOUNTER — Encounter: Payer: Self-pay | Admitting: Nurse Practitioner

## 2021-12-04 VITALS — BP 118/60 | HR 70 | Ht 70.0 in | Wt 224.0 lb

## 2021-12-04 DIAGNOSIS — I2510 Atherosclerotic heart disease of native coronary artery without angina pectoris: Secondary | ICD-10-CM

## 2021-12-04 DIAGNOSIS — G4733 Obstructive sleep apnea (adult) (pediatric): Secondary | ICD-10-CM

## 2021-12-04 DIAGNOSIS — I2583 Coronary atherosclerosis due to lipid rich plaque: Secondary | ICD-10-CM

## 2021-12-04 DIAGNOSIS — E7800 Pure hypercholesterolemia, unspecified: Secondary | ICD-10-CM

## 2021-12-04 DIAGNOSIS — I10 Essential (primary) hypertension: Secondary | ICD-10-CM | POA: Diagnosis not present

## 2021-12-04 MED ORDER — METOPROLOL TARTRATE 25 MG PO TABS: ORAL_TABLET | ORAL | 3 refills | Status: DC

## 2021-12-04 MED ORDER — NITROGLYCERIN 0.4 MG SL SUBL: SUBLINGUAL_TABLET | SUBLINGUAL | 3 refills | Status: AC

## 2021-12-04 NOTE — Patient Instructions (Signed)
Medication Instructions:   Your physician recommends that you continue on your current medications as directed. Please refer to the Current Medication list given to you today.   *If you need a refill on your cardiac medications before your next appointment, please call your pharmacy*   Lab Work:  None ordered.  If you have labs (blood work) drawn today and your tests are completely normal, you will receive your results only by: MyChart Message (if you have MyChart) OR A paper copy in the mail If you have any lab test that is abnormal or we need to change your treatment, we will call you to review the results.   Testing/Procedures:  None ordered.   Follow-Up: At [facility], you and your health needs are our priority.  As part of our continuing mission to provide you with exceptional heart care, we have created designated Provider Care Teams.  These Care Teams include your primary Cardiologist (physician) and Advanced Practice Providers (APPs -  Physician Assistants and Nurse Practitioners) who all work together to provide you with the care you need, when you need it.  We recommend signing up for the patient portal called "MyChart".  Sign up information is provided on this After Visit Summary.  MyChart is used to connect with patients for Virtual Visits (Telemedicine).  Patients are able to view lab/test results, encounter notes, upcoming appointments, etc.  Non-urgent messages can be sent to your provider as well.   To learn more about what you can do with MyChart, go to [email or web address].    Your next appointment:   1 year(s)  The format for your next appointment:   In Person  Provider:   Traci Turner, MD     Other Instructions  Your physician wants you to follow-up in: 1 year with Dr. Turner.  You will receive a reminder letter in the mail two months in advance. If you don't receive a letter, please call our office to schedule the follow-up  appointment.   Important Information About Sugar       

## 2021-12-07 ENCOUNTER — Encounter: Payer: Self-pay | Admitting: Pharmacist

## 2021-12-21 ENCOUNTER — Other Ambulatory Visit: Payer: Self-pay | Admitting: Cardiology

## 2022-01-29 ENCOUNTER — Other Ambulatory Visit (HOSPITAL_COMMUNITY): Payer: Self-pay

## 2022-02-13 DIAGNOSIS — G4733 Obstructive sleep apnea (adult) (pediatric): Secondary | ICD-10-CM | POA: Diagnosis not present

## 2022-04-19 DIAGNOSIS — R197 Diarrhea, unspecified: Secondary | ICD-10-CM | POA: Diagnosis not present

## 2022-04-19 DIAGNOSIS — B349 Viral infection, unspecified: Secondary | ICD-10-CM | POA: Diagnosis not present

## 2022-05-17 DIAGNOSIS — G4733 Obstructive sleep apnea (adult) (pediatric): Secondary | ICD-10-CM | POA: Diagnosis not present

## 2022-08-16 DIAGNOSIS — G4733 Obstructive sleep apnea (adult) (pediatric): Secondary | ICD-10-CM | POA: Diagnosis not present

## 2022-09-13 DIAGNOSIS — K219 Gastro-esophageal reflux disease without esophagitis: Secondary | ICD-10-CM | POA: Diagnosis not present

## 2022-09-13 DIAGNOSIS — L309 Dermatitis, unspecified: Secondary | ICD-10-CM | POA: Diagnosis not present

## 2022-09-13 DIAGNOSIS — E782 Mixed hyperlipidemia: Secondary | ICD-10-CM | POA: Diagnosis not present

## 2022-09-13 DIAGNOSIS — I2510 Atherosclerotic heart disease of native coronary artery without angina pectoris: Secondary | ICD-10-CM | POA: Diagnosis not present

## 2022-09-13 DIAGNOSIS — I10 Essential (primary) hypertension: Secondary | ICD-10-CM | POA: Diagnosis not present

## 2022-09-13 DIAGNOSIS — Z9989 Dependence on other enabling machines and devices: Secondary | ICD-10-CM | POA: Diagnosis not present

## 2022-09-13 DIAGNOSIS — Z0000 Encounter for general adult medical examination without abnormal findings: Secondary | ICD-10-CM | POA: Diagnosis not present

## 2022-09-13 DIAGNOSIS — G4733 Obstructive sleep apnea (adult) (pediatric): Secondary | ICD-10-CM | POA: Diagnosis not present

## 2022-11-14 ENCOUNTER — Other Ambulatory Visit: Payer: Self-pay | Admitting: Cardiology

## 2022-11-14 DIAGNOSIS — I2510 Atherosclerotic heart disease of native coronary artery without angina pectoris: Secondary | ICD-10-CM

## 2022-11-14 DIAGNOSIS — E7800 Pure hypercholesterolemia, unspecified: Secondary | ICD-10-CM

## 2022-11-29 ENCOUNTER — Telehealth: Payer: Self-pay | Admitting: Cardiology

## 2022-11-29 NOTE — Telephone Encounter (Signed)
Spoke with patient and advised that [employer] info was sent to his MyChart. Patient verbalized understanding.

## 2022-11-29 NOTE — Telephone Encounter (Signed)
Kennedy Bucker renewed - Info provided via [employer] No. [id number]  BIN [id number]  PCN [id number]  PC Group [id number]   [employer]Well ID [id number]

## 2022-11-29 NOTE — Telephone Encounter (Signed)
Patient called and said that his copay has jumped back up to $450 to get his medication Alirocumab (PRALUENT) 75 MG/ML SOAJ. Want to know if there can be some assistance to help him with cost

## 2022-12-05 NOTE — Progress Notes (Unsigned)
Cardiology Office Note:    Date:  12/06/2022  ID:  Tyrone Sherman, DOB 12/13/1950, MRN [id number] PCP: Marden Noble, MD (Inactive)  [name] Cardiologist:  Armanda Magic, MD       Patient Profile:      - Coronary artery disease s/p CABG 05/2017 (LIMA-LAD, SVG-Dx, SVG-OM1/OM2, SVG-AM/PDA) - TTE 05/20/2017: Mild LVH, EF 60-65, no RWMA, GR 1 DD, mild MR - Hypertension - Hyperlipidemia - Pre-CABG Dopplers 05/21/2017: Minimal wall thickening without significant stenosis - Sleep apnea on CPAP - GERD         History of Present Illness:  Discussed the use of AI scribe software for clinical note transcription with the patient, who gave verbal consent to proceed.  Tyrone Sherman is a 72 y.o. male who returns for follow-up of CAD.  He was last seen in clinic in November 2023 by Eligha Bridegroom, NP.   He is here alone.  He denies any chest pain, pressure, or unusual shortness of breath, even during physical activity. He also denies any episodes of syncope, breathing difficulties when lying flat, leg swelling, bleeding, or black stools. He has never smoked and is currently working with his [employer], which keeps him active.     ROS   See HPI     Studies Reviewed:   EKG Interpretation Date/Time:  Monday December 06 2022 09:31:43 EST Ventricular Rate:  74 PR Interval:  146 QRS Duration:  96 QT Interval:  402 QTC Calculation: 446 R Axis:   226  Text Interpretation: Normal sinus rhythm Right superior axis deviation Nonspecific T wave abnormality No significant change since last tracing Confirmed by Tereso Newcomer [phone]) on 12/06/2022 9:41:37 AM    Results   LABS -Per [facility] Total cholesterol: 73 mg/dL (19/14/7829) HDL: 38 mg/dL (56/21/3086) LDL: 12 mg/dL (57/84/6962) Triglycerides: 134 mg/dL (95/28/4132) Hemoglobin: 14.1 g/dL (44/01/270) Potassium: 4.7 mmol/L (09/13/2022) Creatinine: 0.88 mg/dL (53/66/4403)       Risk Assessment/Calculations:              Physical Exam:   VS:  BP 123/63   Pulse 74   Ht 5\' 10"  (1.778 m)   Wt 228 lb 9.6 oz (103.7 kg)   SpO2 97%   BMI 32.80 kg/m    Wt Readings from Last 3 Encounters:  12/06/22 228 lb 9.6 oz (103.7 kg)  12/04/21 224 lb (101.6 kg)  10/27/20 223 lb 9.6 oz (101.4 kg)    Constitutional:      Appearance: Healthy appearance. Not in distress.  Neck:     Vascular: No carotid bruit. JVD normal.  Pulmonary:     Breath sounds: Normal breath sounds. No wheezing. No rales.  Cardiovascular:     Normal rate. Regular rhythm.     Murmurs: There is no murmur.  Edema:    Peripheral edema absent.  Abdominal:     Palpations: Abdomen is soft.        Assessment and Plan:   Assessment & Plan Coronary artery disease involving native coronary artery of native heart without angina pectoris Status post CABG in 2019.  He is doing well without symptoms to suggest angina.  Continue aspirin 81 mg daily, Praluent 75 mg every 2 weeks, Crestor 10 mg daily, Zetia 10 mg daily, nitroglycerin as needed.  Follow-up 1 year. Pure hypercholesterolemia LDL optimal.  Continue Praluent 75 mg twice monthly, Zetia 10 mg daily, Crestor 10 mg daily. Essential hypertension Blood pressure controlled.  Continue metoprolol tartrate 12.5 mg twice  daily, valsartan/HCTZ 80/12.5 mg daily.      Dispo:  Return in about 1 year (around 12/06/2023) for Routine Follow Up, w/ Dr. Mayford Knife.  Signed, Tereso Newcomer, PA-C

## 2022-12-06 ENCOUNTER — Encounter: Payer: Self-pay | Admitting: Physician Assistant

## 2022-12-06 ENCOUNTER — Ambulatory Visit: Payer: Medicare Other | Attending: Physician Assistant | Admitting: Physician Assistant

## 2022-12-06 VITALS — BP 123/63 | HR 74 | Ht 70.0 in | Wt 228.6 lb

## 2022-12-06 DIAGNOSIS — E7800 Pure hypercholesterolemia, unspecified: Secondary | ICD-10-CM

## 2022-12-06 DIAGNOSIS — I2510 Atherosclerotic heart disease of native coronary artery without angina pectoris: Secondary | ICD-10-CM

## 2022-12-06 DIAGNOSIS — I10 Essential (primary) hypertension: Secondary | ICD-10-CM | POA: Diagnosis not present

## 2022-12-06 NOTE — Assessment & Plan Note (Signed)
LDL optimal.  Continue Praluent 75 mg twice monthly, Zetia 10 mg daily, Crestor 10 mg daily.

## 2022-12-06 NOTE — Assessment & Plan Note (Signed)
Status post CABG in 2019.  He is doing well without symptoms to suggest angina.  Continue aspirin 81 mg daily, Praluent 75 mg every 2 weeks, Crestor 10 mg daily, Zetia 10 mg daily, nitroglycerin as needed.  Follow-up 1 year.

## 2022-12-06 NOTE — Assessment & Plan Note (Signed)
Blood pressure controlled.  Continue metoprolol tartrate 12.5 mg twice daily, valsartan/HCTZ 80/12.5 mg daily.

## 2022-12-06 NOTE — Patient Instructions (Signed)
Medication Instructions:  Your physician recommends that you continue on your current medications as directed. Please refer to the Current Medication list given to you today. *If you need a refill on your cardiac medications before your next appointment, please call your pharmacy*   Lab Work: None ordered If you have labs (blood work) drawn today and your tests are completely normal, you will receive your results only by: [place] (if you have MyChart) OR A paper copy in the mail If you have any lab test that is abnormal or we need to change your treatment, we will call you to review the results.   Testing/Procedures: None ordered   Follow-Up: At [facility], you and your health needs are our priority.  As part of our continuing mission to provide you with exceptional heart care, we have created designated Provider Care Teams.  These Care Teams include your primary Cardiologist (physician) and Advanced Practice Providers (APPs -  Physician Assistants and Nurse Practitioners) who all work together to provide you with the care you need, when you need it.  We recommend signing up for the patient portal called "MyChart".  Sign up information is provided on this After Visit Summary.  MyChart is used to connect with patients for Virtual Visits (Telemedicine).  Patients are able to view lab/test results, encounter notes, upcoming appointments, etc.  Non-urgent messages can be sent to your provider as well.   To learn more about what you can do with MyChart, go to [email or web address].    Your next appointment:   12 month(s)  Provider:   Fransico Him, MD     Other Instructions

## 2022-12-16 ENCOUNTER — Other Ambulatory Visit: Payer: Self-pay | Admitting: Nurse Practitioner

## 2022-12-16 ENCOUNTER — Other Ambulatory Visit: Payer: Self-pay | Admitting: Cardiology

## 2022-12-21 ENCOUNTER — Other Ambulatory Visit: Payer: Self-pay

## 2022-12-21 MED ORDER — METOPROLOL TARTRATE 25 MG PO TABS: ORAL_TABLET | ORAL | 3 refills | Status: DC

## 2023-02-24 DIAGNOSIS — G4733 Obstructive sleep apnea (adult) (pediatric): Secondary | ICD-10-CM | POA: Diagnosis not present

## 2023-05-26 DIAGNOSIS — G4733 Obstructive sleep apnea (adult) (pediatric): Secondary | ICD-10-CM | POA: Diagnosis not present

## 2023-09-15 DIAGNOSIS — M25562 Pain in left knee: Secondary | ICD-10-CM | POA: Diagnosis not present

## 2023-09-15 DIAGNOSIS — I2510 Atherosclerotic heart disease of native coronary artery without angina pectoris: Secondary | ICD-10-CM | POA: Diagnosis not present

## 2023-09-15 DIAGNOSIS — Z0000 Encounter for general adult medical examination without abnormal findings: Secondary | ICD-10-CM | POA: Diagnosis not present

## 2023-09-15 DIAGNOSIS — E782 Mixed hyperlipidemia: Secondary | ICD-10-CM | POA: Diagnosis not present

## 2023-09-15 DIAGNOSIS — G4733 Obstructive sleep apnea (adult) (pediatric): Secondary | ICD-10-CM | POA: Diagnosis not present

## 2023-09-15 DIAGNOSIS — I10 Essential (primary) hypertension: Secondary | ICD-10-CM | POA: Diagnosis not present

## 2023-09-15 DIAGNOSIS — Z23 Encounter for immunization: Secondary | ICD-10-CM | POA: Diagnosis not present

## 2023-09-15 DIAGNOSIS — R739 Hyperglycemia, unspecified: Secondary | ICD-10-CM | POA: Diagnosis not present

## 2023-09-15 DIAGNOSIS — K219 Gastro-esophageal reflux disease without esophagitis: Secondary | ICD-10-CM | POA: Diagnosis not present

## 2023-09-21 DIAGNOSIS — M1712 Unilateral primary osteoarthritis, left knee: Secondary | ICD-10-CM | POA: Diagnosis not present

## 2023-10-18 DIAGNOSIS — K529 Noninfective gastroenteritis and colitis, unspecified: Secondary | ICD-10-CM | POA: Diagnosis not present

## 2023-10-18 DIAGNOSIS — R197 Diarrhea, unspecified: Secondary | ICD-10-CM | POA: Diagnosis not present

## 2023-10-19 DIAGNOSIS — M1712 Unilateral primary osteoarthritis, left knee: Secondary | ICD-10-CM | POA: Diagnosis not present

## 2023-10-23 ENCOUNTER — Encounter: Payer: Self-pay | Admitting: Cardiology

## 2023-11-09 ENCOUNTER — Encounter: Payer: Self-pay | Admitting: Cardiology

## 2023-11-10 ENCOUNTER — Other Ambulatory Visit (HOSPITAL_COMMUNITY): Payer: Self-pay

## 2023-11-10 DIAGNOSIS — Z860101 Personal history of adenomatous and serrated colon polyps: Secondary | ICD-10-CM | POA: Diagnosis not present

## 2023-11-10 DIAGNOSIS — Z09 Encounter for follow-up examination after completed treatment for conditions other than malignant neoplasm: Secondary | ICD-10-CM | POA: Diagnosis not present

## 2023-11-10 DIAGNOSIS — K648 Other hemorrhoids: Secondary | ICD-10-CM | POA: Diagnosis not present

## 2023-11-10 NOTE — Telephone Encounter (Signed)
 Patient Advocate Encounter   The patient was approved for a [employer] grant that will help cover the cost of Praluent  Total amount awarded, $2,500.  Effective: 11/08/23 - 11/06/24   APW:[id number] ERW:[id number] Hmnle:[id number] PI:[id number]   Please send in prescription for Praluent  to [facility] including grant billing information in RX comment [phone], ERW:[id number], Group: [id number], PI:[id number])  Ileana Lehmann, CPhT  Pharmacy Patient Advocate Specialist  Direct Number: [phone] Fax: [phone]

## 2023-11-15 ENCOUNTER — Other Ambulatory Visit: Payer: Self-pay | Admitting: Cardiology

## 2023-11-15 DIAGNOSIS — I2510 Atherosclerotic heart disease of native coronary artery without angina pectoris: Secondary | ICD-10-CM

## 2023-11-15 DIAGNOSIS — E7800 Pure hypercholesterolemia, unspecified: Secondary | ICD-10-CM

## 2023-11-22 ENCOUNTER — Telehealth (HOSPITAL_BASED_OUTPATIENT_CLINIC_OR_DEPARTMENT_OTHER): Payer: Self-pay | Admitting: *Deleted

## 2023-11-22 NOTE — Telephone Encounter (Signed)
 Patient scheduled on 11/28/23 with Reche Finder, NP for pre-op clearance.

## 2023-11-22 NOTE — Telephone Encounter (Signed)
 Primary Cardiologist:Traci Turner, MD  Chart reviewed as part of pre-operative protocol coverage. Because of Tyrone Sherman's past medical history and time since last visit, he/she will require a follow-up visit in order to better assess preoperative cardiovascular risk.  Pre-op covering staff: - Please schedule appointment and call patient to inform them. - Please contact requesting surgeon's office via preferred method (i.e, phone, fax) to inform them of need for appointment prior to surgery.   Tyrone EMERSON Bane, NP-C  11/22/2023, 2:18 PM [address] [place], [place] [address] Office [phone] Fax [phone]

## 2023-11-22 NOTE — Telephone Encounter (Signed)
   Pre-operative Risk Assessment    Patient Name: Tyrone Sherman  DOB: 10-Sep-1950 MRN: [id number]   Date of last office visit: 12/06/22 GLENDIA FERRIER, PAC Date of next office visit: NONE   Request for Surgical Clearance    Procedure:  LEFT TOTAL KNEE ARTHROPLASTY  Date of Surgery:  Clearance TBD                                Surgeon:  DR. DANIEL MARCHWIANY Surgeon's Group or Practice Name:  [facility] Phone number:  [phone] KELLY [facility] Fax number:  [phone]   Type of Clearance Requested:   - Medical    Type of Anesthesia:  Spinal   Additional requests/questions:    Bonney Niels Jest   11/22/2023, 1:45 PM

## 2023-11-25 ENCOUNTER — Encounter (HOSPITAL_BASED_OUTPATIENT_CLINIC_OR_DEPARTMENT_OTHER): Payer: Self-pay

## 2023-11-28 ENCOUNTER — Ambulatory Visit (HOSPITAL_BASED_OUTPATIENT_CLINIC_OR_DEPARTMENT_OTHER): Admitting: Family

## 2023-11-28 ENCOUNTER — Encounter (HOSPITAL_BASED_OUTPATIENT_CLINIC_OR_DEPARTMENT_OTHER): Payer: Self-pay | Admitting: Family

## 2023-11-28 VITALS — BP 130/70 | HR 71 | Ht <= 58 in | Wt 230.7 lb

## 2023-11-28 DIAGNOSIS — I10 Essential (primary) hypertension: Secondary | ICD-10-CM

## 2023-11-28 DIAGNOSIS — Z01810 Encounter for preprocedural cardiovascular examination: Secondary | ICD-10-CM | POA: Diagnosis not present

## 2023-11-28 DIAGNOSIS — E7800 Pure hypercholesterolemia, unspecified: Secondary | ICD-10-CM

## 2023-11-28 DIAGNOSIS — E785 Hyperlipidemia, unspecified: Secondary | ICD-10-CM

## 2023-11-28 DIAGNOSIS — I2510 Atherosclerotic heart disease of native coronary artery without angina pectoris: Secondary | ICD-10-CM | POA: Diagnosis not present

## 2023-11-28 DIAGNOSIS — I2583 Coronary atherosclerosis due to lipid rich plaque: Secondary | ICD-10-CM

## 2023-11-28 MED ORDER — EZETIMIBE 10 MG PO TABS: 10.0000 mg | ORAL_TABLET | Freq: Every day | ORAL | 3 refills | Status: AC

## 2023-11-28 MED ORDER — METOPROLOL TARTRATE 25 MG PO TABS: ORAL_TABLET | ORAL | 3 refills | Status: AC

## 2023-11-28 MED ORDER — PRALUENT 75 MG/ML ~~LOC~~ SOAJ: SUBCUTANEOUS | 3 refills | Status: DC

## 2023-11-28 NOTE — Patient Instructions (Addendum)
 Medication Instructions:   Your physician recommends that you continue on your current medications as directed. Please refer to the Current Medication list given to you today.  YOU MAY HOLD ASPIRIN  ONE WEEK PRIOR TO YOUR KNEE SURGERY  *If you need a refill on your cardiac medications before your next appointment, please call your pharmacy*   Follow-Up: At [facility], you and your health needs are our priority.  As part of our continuing mission to provide you with exceptional heart care, our providers are all part of one team.  This team includes your primary Cardiologist (physician) and Advanced Practice Providers or APPs (Physician Assistants and Nurse Practitioners) who all work together to provide you with the care you need, when you need it.  Your next appointment:   1 year(s)  Provider:   Wilbert Bihari, MD      Other Instructions  You may hold Aspirin  for one week prior to your knee surgery  Coricidin for over the counter cold medicine.

## 2023-11-28 NOTE — Progress Notes (Signed)
 Cardiology Office Note   Date:  11/28/2023  ID:  Tyrone Sherman, DOB 11/11/1950, MRN [id number] PCP: Tyrone Charleston, MD (Inactive)  [facility] Providers Cardiologist:  Tyrone Bihari, MD     History of Present Illness Tyrone Sherman is a 74 y.o. male with history of CAD (s/p CABG 05/2017 LIMA-LAD, SVG-CX, SVG-OM1/OM 2, SVG-AM/PDA), hypertension, hyperlipidemia, OSA on CPAP, GERD.  Last evaluated 12/06/2022.  He was doing well without angina.  LDL at goal on Praluent  75 mg twice monthly, Zetia  10 mg daily, Crestor  10 mg daily.  BP was at goal.  He was recommended for follow-up in 1 year.  Presents today for preoperative clearance for left total knee arthroplasty with Dr. Edna. Reports no shortness of breath nor dyspnea on exertion. Reports no chest pain, pressure, or tightness. No edema, orthopnea, PND. Reports no palpitations.  Checks BP at home intermittently and has been well controlled by those readings. Wearing CPAP regularly.   ROS: Please see the history of present illness.    All other systems reviewed and are negative.   Studies Reviewed EKG Interpretation Date/Time:  Monday November 28 2023 09:02:24 EST Ventricular Rate:  71 PR Interval:  156 QRS Duration:  100 QT Interval:  404 QTC Calculation: 439 R Axis:   159  Text Interpretation: Normal sinus rhythm Right axis deviation Nonspecific T wave abnormality No acute ST/T wave changes. Confirmed by Tyrone Sherman ([id number]) on 11/28/2023 9:07:46 AM    Cardiac Studies & Procedures   ______________________________________________________________________________________________ CARDIAC CATHETERIZATION  CARDIAC CATHETERIZATION 05/20/2017  Conclusion  Prox LAD lesion is 80% stenosed.  Ost 1st Diag lesion is 80% stenosed.  Mid Cx lesion is 95% stenosed.  Ost 2nd Mrg lesion is 70% stenosed.  Mid RCA lesion is 90% stenosed.  Post Atrio lesion is 90% stenosed.  The left ventricular ejection fraction is 55-65% by  visual estimate.  The left ventricular systolic function is normal.  LV end diastolic pressure is normal.  There is no aortic valve stenosis.  Severe three vessel CAD.  Given accelerating symptoms and angina at rest, will admit for Sherman TCTS consult.  Start heparin  8 hours post sheath pull.  Findings Coronary Findings Diagnostic  Dominance: Right  Left Anterior Descending Prox LAD lesion is 80% stenosed. The lesion is calcified.  First Diagonal Branch Ost 1st Diag lesion is 80% stenosed.  Left Circumflex Collaterals Dist Cx filled by collaterals from Prox RCA.  Mid Cx lesion is 95% stenosed.  Second Obtuse Marginal Branch Ost 2nd Mrg lesion is 70% stenosed.  Right Coronary Artery Mid RCA lesion is 90% stenosed. The lesion is severely calcified.  Right Posterior Atrioventricular Artery Post Atrio lesion is 90% stenosed.  Intervention  No interventions have been documented.   STRESS TESTS  EXERCISE TOLERANCE TEST (ETT) 05/17/2017  Interpretation Summary  Blood pressure demonstrated a hypotensive response to exercise.  There was 1.5 mm of J-point depression with horizontal ST segment depression in the inferior lateral leads at peak exercise which became downsloping in recovery  The patient exercised for 9 minutes achieving 92% of maximum predicted heart rate.  Normal exercise tolerance achieving 10.1 METS  Positive exercise stress test for inducible ischemia  Recommend cardiac catheterization   ECHOCARDIOGRAM  ECHOCARDIOGRAM COMPLETE 05/21/2017  Narrative *Anselmo* *[facility]* [address] [place], [place] [address] [phone]  ------------------------------------------------------------------- Transthoracic Echocardiography  Patient:    Tyrone Sherman, Tyrone Sherman MR #:       [id number] Study Date: 05/21/2017 Gender:  M Age:        70 Height:     177.8 cm Weight:     92.2 kg BSA:        2.15 m^2 Pt. Status: Room:        3E23C  ATTENDING    Tyrone Bihari, MD ORDERING     Tyrone Jude MD REFERRING    Tyrone Jude MD ADMITTING    Tyrone Sherman PERFORMING   Tyrone Sherman SONOGRAPHER  Tyrone Sherman  cc:  ------------------------------------------------------------------- LV EF: 60% -   65%  ------------------------------------------------------------------- Indications:      CAD of native vessels 414.01.  ------------------------------------------------------------------- History:   PMH:  Pre-CABG.  Risk factors:  Hypertension. Dyslipidemia.  ------------------------------------------------------------------- Study Conclusions  - Left ventricle: The cavity size was normal. Wall thickness was increased in a pattern of mild LVH. Systolic function was normal. The estimated ejection fraction was in the range of 60% to 65%. Wall motion was normal; there were no regional wall motion abnormalities. Doppler parameters are consistent with abnormal left ventricular relaxation (grade 1 diastolic dysfunction). Indeterminate filling pressures. - Mitral valve: There was mild regurgitation. - Right ventricle: The cavity size was mildly dilated. Wall thickness was normal. - Atrial septum: No defect or patent foramen ovale was identified.  ------------------------------------------------------------------- Study data:  No prior study was available for comparison.  Study status:  Routine.  Procedure:  The patient reported no pain pre or post test. Transthoracic echocardiography. Image quality was adequate.  Study completion:  There were no complications. Transthoracic echocardiography.  M-mode, complete 2D, spectral Doppler, and color Doppler.  Birthdate:  Patient birthdate: 07/24/1950.  Age:  Patient is 73 yr old.  Sex:  Gender: male. BMI: 29.2 kg/m^2.  Blood pressure:     132/83  Patient status: Sherman.  Study date:  Study date: 05/21/2017. Study time: 08:32 AM.  Location:  Echo  laboratory.  -------------------------------------------------------------------  ------------------------------------------------------------------- Left ventricle:  The cavity size was normal. Wall thickness was increased in a pattern of mild LVH. Systolic function was normal. The estimated ejection fraction was in the range of 60% to 65%. Wall motion was normal; there were no regional wall motion abnormalities. Doppler parameters are consistent with abnormal left ventricular relaxation (grade 1 diastolic dysfunction). Indeterminate filling pressures.  ------------------------------------------------------------------- Aortic valve:   Structurally normal valve.   Cusp separation was normal.  Doppler:  Transvalvular velocity was within the normal range. There was no stenosis. There was no regurgitation.  ------------------------------------------------------------------- Aorta:  Aortic root: The aortic root was normal in size.  ------------------------------------------------------------------- Mitral valve:   Structurally normal valve.    Doppler:  There was mild regurgitation.  ------------------------------------------------------------------- Left atrium:  The atrium was normal in size.  ------------------------------------------------------------------- Atrial septum:  No defect or patent foramen ovale was identified.  ------------------------------------------------------------------- Right ventricle:  The cavity size was mildly dilated. Wall thickness was normal. Systolic function was normal.  ------------------------------------------------------------------- Pulmonic valve:    The valve appears to be grossly normal. Doppler:  There was no significant regurgitation.  ------------------------------------------------------------------- Tricuspid valve:   Structurally normal valve.   Leaflet separation was normal.  Doppler:  Transvalvular velocity was within the  normal range. There was no significant regurgitation.  ------------------------------------------------------------------- Right atrium:  The atrium was normal in size.  ------------------------------------------------------------------- Pericardium:  There was no pericardial effusion.  ------------------------------------------------------------------- Systemic veins: Inferior vena cava: The vessel was normal in size. The respirophasic diameter changes were in the normal range (>= 50%), consistent with normal central venous pressure.  -------------------------------------------------------------------  Measurements  Left ventricle                           Value        Reference LV ID, ED, PLAX chordal                  48.8  mm     43 - 52 LV ID, ES, PLAX chordal                  30.3  mm     23 - 38 LV fx shortening, PLAX chordal           38    %      >=29 LV PW thickness, ED                      12.2  mm     ---------- IVS/LV PW ratio, ED                      0.93         <=1.3 Stroke volume, 2D                        104   ml     ---------- Stroke volume/bsa, 2D                    48    ml/m^2 ---------- LV e&', lateral                           5.11  cm/s   ---------- LV E/e&', lateral                         11.12        ---------- LV e&', medial                            4.54  cm/s   ---------- LV E/e&', medial                          12.51        ---------- LV e&', average                           4.83  cm/s   ---------- LV E/e&', average                         11.77        ----------  Ventricular septum                       Value        Reference IVS thickness, ED                        11.4  mm     ----------  LVOT                                     Value        Reference LVOT ID, S  21    mm     ---------- LVOT area                                3.46  cm^2   ---------- LVOT peak velocity, S                    122   cm/s    ---------- LVOT mean velocity, S                    78.1  cm/s   ---------- LVOT VTI, S                              30.1  cm     ---------- LVOT peak gradient, S                    6     mm Hg  ----------  Aorta                                    Value        Reference Aortic root ID, ED                       34    mm     ----------  Left atrium                              Value        Reference LA ID, A-P, ES                           49    mm     ---------- LA ID/bsa, A-P                 (H)       2.27  cm/m^2 <=2.2 LA volume, S                             67.5  ml     ---------- LA volume/bsa, S                         31.3  ml/m^2 ---------- LA volume, ES, 1-p A4C                   63.3  ml     ---------- LA volume/bsa, ES, 1-p A4C               29.4  ml/m^2 ---------- LA volume, ES, 1-p A2C                   70.6  ml     ---------- LA volume/bsa, ES, 1-p A2C               32.8  ml/m^2 ----------  Mitral valve                             Value        Reference Mitral E-wave peak velocity  56.8  cm/s   ---------- Mitral A-wave peak velocity              67.4  cm/s   ---------- Mitral deceleration time                 183   ms     150 - 230 Mitral E/A ratio, peak                   0.8          ----------  Right atrium                             Value        Reference RA ID, S-I, ES, A4C            (H)       62.8  mm     34 - 49 RA area, ES, A4C               (H)       20.3  cm^2   8.3 - 19.5 RA volume, ES, A/L                       55.9  ml     ---------- RA volume/bsa, ES, A/L                   25.9  ml/m^2 ----------  Right ventricle                          Value        Reference TAPSE                                    19.1  mm     ---------- RV s&', lateral, S                        10.1  cm/s   ----------  Legend: (L)  and  (H)  mark values outside specified reference range.  ------------------------------------------------------------------- Prepared and  Electronically Authenticated by  Pearla Rout, MD 2019-05-04T12:45:13   TEE  ECHO TEE 05/24/2017  Interpretation Summary  Aortic valve: The valve is trileaflet. No stenosis. No regurgitation.  Mitral valve: Mild regurgitation with central jet.  Right ventricle: Normal cavity size, wall thickness and ejection fraction.  Left ventricle: Normal LVEF 50-55%, no regional wall motion abnormalities.  Aorta: No dilation, minimal plaque burden and calcification.  Post Bypass:  No acute changes to Tricuspid, Pulmonic, Mitral and Aortic Valve. LVEF 55-60% with dopamine , CO > 6.0L. No dissection noted after aortic cannula removed.        ______________________________________________________________________________________________      Risk Assessment/Calculations           Physical Exam VS:  BP 130/70   Pulse 71   Ht 2' (0.61 m)   Wt 230 lb 11.2 oz (104.6 kg)   SpO2 95%   BMI 281.60 kg/m        Wt Readings from Last 3 Encounters:  11/28/23 230 lb 11.2 oz (104.6 kg)  12/06/22 228 lb 9.6 oz (103.7 kg)  12/04/21 224 lb (101.6 kg)    GEN: Well nourished, well developed in no acute distress NECK: No JVD; No carotid bruits CARDIAC:  RRR, no murmurs, rubs, gallops RESPIRATORY:  Clear to auscultation without rales, wheezing or rhonchi  ABDOMEN: Soft, non-tender, non-distended EXTREMITIES:  No edema; No deformity   ASSESSMENT AND PLAN  CAD - Stable with no anginal symptoms. No indication for ischemic evaluation.  Continue GDMT praluent  75mg  twice per month, aspirin  81mg  daily, zetia  10mg  daily, lopressor  12.5mg  BID, crestor  10mg  daily. Discussed to monitor BP at home at least 2 hours after medications and sitting for 5-10 minutes.     HLD, LDL goal <70 - Continue GDMT praluent  75mg  twice per month, aspirin  81mg  daily, zetia  10mg  daily, lopressor  12.5mg  BID, crestor  10mg  daily. Discussed to monitor BP at home at least 2 hours after medications and sitting for 5-10 minutes.      OSA - endorses wearing CPAP regularly.   HTN - BP at goal in recheck. Continue present antihypertensive regimen lopressor  12.5mg  daily, valsartan-hydrochlorothiazide  80-12.5mg  daily.     Dispo: follow up in 1 year  Signed, Reche GORMAN Finder, NP

## 2024-01-23 NOTE — Telephone Encounter (Signed)
"  ° °  Patient Name: Tyrone Sherman  DOB: 1950/03/02 MRN: [id number]  Primary Cardiologist: Wilbert Bihari, MD  Chart reviewed as part of pre-operative protocol coverage. Given past medical history and time since last visit, based on ACC/AHA guidelines, [name] is at acceptable risk for the planned procedure without further cardiovascular testing.   The patient was advised that if he develops new symptoms prior to surgery to contact our office to arrange for a follow-up visit, and he verbalized understanding.  I will route this recommendation to the requesting party via Epic fax function and remove from pre-op pool.  Please call with questions.  Reche GORMAN Finder, NP 01/23/2024, 4:55 PM  "

## 2024-01-23 NOTE — Telephone Encounter (Signed)
 Tyrone Sherman,    You saw this patient on 11/28/2023 for pre-op clearance. Can you please comment on his CV risk to proceed with left total knee arthroplasty? The note mentions the surgery, but I did not see the verbiage concerning if he was cleared. Thank you so much!  Please respond to pre-op pool.  KL

## 2024-01-23 NOTE — Telephone Encounter (Signed)
 Requesting office sent duplicate inquiring if pt had been cleared. Pt was seen by Reche Finder, NP 11/28/23.

## 2024-02-02 ENCOUNTER — Ambulatory Visit: Payer: Self-pay | Admitting: Emergency Medicine

## 2024-02-02 DIAGNOSIS — G8929 Other chronic pain: Secondary | ICD-10-CM

## 2024-02-02 NOTE — H&P (Addendum)
 TOTAL KNEE ADMISSION H&P  Patient is being admitted for left total knee arthroplasty.  Subjective:  Chief Complaint:left knee pain.  HPI: Tyrone Sherman, 74 y.o. male, has a history of pain and functional disability in the left knee due to arthritis and has failed non-surgical conservative treatments for greater than 12 weeks to includeNSAID's and/or analgesics, corticosteriod injections, use of assistive devices, and activity modification.  Onset of symptoms was gradual, starting several years ago with gradually worsening course since that time. The patient noted no past surgery on the left knee(s).  Patient currently rates pain in the left knee(s) at 9 out of 10 with activity. Patient has night pain, worsening of pain with activity and weight bearing, pain that interferes with activities of daily living, and pain with passive range of motion.  Patient has evidence of periarticular osteophytes and joint space narrowing by imaging studies.  There is no active infection.  Patient Active Problem List   Diagnosis Date Noted   S/P CABG x 6 05/24/2017   CAD (coronary artery disease)    Essential hypertension    Abnormal stress test 05/20/2017   Hyperlipidemia    Obstructive sleep apnea on CPAP    Allergic rhinitis    Trigger thumb of right hand    DJD (degenerative joint disease)    GERD (gastroesophageal reflux disease)    Ventral hernia    Past Medical History:  Diagnosis Date   Allergic rhinitis    Benign essential hypertension    Chest tightness    EXERTIONAL   DJD (degenerative joint disease)    GERD (gastroesophageal reflux disease)    Hyperlipidemia    Hypertension    Metatarsal fracture 2012   LEFT FOOT   Obstructive sleep apnea on CPAP    Onychomycosis    Trigger thumb of right hand    Ventral hernia    Ventral hernia     Past Surgical History:  Procedure Laterality Date   CORONARY ARTERY BYPASS GRAFT N/A 05/24/2017   Procedure: CORONARY ARTERY BYPASS GRAFTING (CABG);   Surgeon: Army Dallas NOVAK, MD;  Location: [facility] OR;  Service: Open Heart Surgery;  Laterality: N/A;  Times 6 using left internal mammary artery to LAD and endoscopically harvested right saphenous vein to Diagonal, OM1, OM2, Acute Marginal, and PDA   Incarcerated umbilical hernia repair     LEFT HEART CATH AND CORONARY ANGIOGRAPHY N/A 05/20/2017   Procedure: LEFT HEART CATH AND CORONARY ANGIOGRAPHY;  Surgeon: Dann Candyce RAMAN, MD;  Location: [facility] INVASIVE CV LAB;  Service: Cardiovascular;  Laterality: N/A;   Left metatarsal fracture repair     Left varicocele repair 1973     TEE WITHOUT CARDIOVERSION N/A 05/24/2017   Procedure: TRANSESOPHAGEAL ECHOCARDIOGRAM (TEE);  Surgeon: Army Dallas NOVAK, MD;  Location: [facility] OR;  Service: Open Heart Surgery;  Laterality: N/A;    Current Outpatient Medications  Medication Sig Dispense Refill Last Dose/Taking   acetaminophen  (TYLENOL ) 325 MG tablet Take 2 tablets (650 mg total) by mouth every 6 (six) hours as needed for mild pain.      Alirocumab  (PRALUENT ) 75 MG/ML SOAJ INJECT 1  SUBCUTANEOUSLY EVERY TWO WEEKS 6 mL 3    ALPRAZolam  (XANAX ) 0.25 MG tablet Take 0.25 mg by mouth at bedtime as needed for anxiety.      Ascorbic Acid (VITAMIN C) 100 MG tablet Take 100 mg by mouth daily.      aspirin  EC 81 MG tablet Take 81 mg by mouth daily.  Cholecalciferol (VITAMIN D3) 2000 units TABS Take 2,000 Units by mouth daily.      citalopram  (CELEXA ) 40 MG tablet Take 40 mg by mouth daily.      ezetimibe  (ZETIA ) 10 MG tablet Take 1 tablet (10 mg total) by mouth daily. 90 tablet 3    folic acid  (FOLVITE ) 1 MG tablet Take 1 mg by mouth daily.      Garlic 500 MG CAPS Take 500 mg by mouth.      metoprolol  tartrate (LOPRESSOR ) 25 MG tablet Take 1/2 (one-half) tablet by mouth twice daily. 90 tablet 3    nitroGLYCERIN  (NITROSTAT ) 0.4 MG SL tablet 1 tablet sublingually Every 5 minutes as needed for chest pain not relieved with rest 30 days 25 tablet 3    Omega 3 1200 MG CAPS  Take 1,200 mg by mouth daily.      omeprazole (PRILOSEC) 20 MG capsule Take 20 mg by mouth 2 (two) times daily before a meal.      rosuvastatin  (CRESTOR ) 10 MG tablet Take 10 mg by mouth daily.      valsartan-hydrochlorothiazide  (DIOVAN-HCT) 80-12.5 MG tablet Take 1 tablet by mouth daily.      vitamin E 100 UNIT capsule Take 100 Units by mouth daily.       No current facility-administered medications for this visit.   Allergies[1]  Social History   Tobacco Use   Smoking status: Never    Passive exposure: Never   Smokeless tobacco: Never  Substance Use Topics   Alcohol use: Never    Family History  Problem Relation Age of Onset   CAD Mother    Other Mother        CABG x8 VESSELS   CAD Father    Other Father        7 VESSEL BYPASS    Hypertension Brother 33   CAD Brother    Hypertension Brother 26   CAD Brother    Hypertension Brother 1   CAD Brother      Review of Systems  Musculoskeletal:  Positive for arthralgias.  All other systems reviewed and are negative.   Objective:  Physical Exam Constitutional:      General: He is not in acute distress.    Appearance: Normal appearance. He is normal weight.  HENT:     Head: Normocephalic and atraumatic.  Eyes:     Extraocular Movements: Extraocular movements intact.     Conjunctiva/sclera: Conjunctivae normal.     Pupils: Pupils are equal, round, and reactive to light.  Cardiovascular:     Rate and Rhythm: Normal rate and regular rhythm.     Pulses: Normal pulses.  Pulmonary:     Effort: Pulmonary effort is normal. No respiratory distress.  Abdominal:     General: There is no distension.     Palpations: Abdomen is soft.  Musculoskeletal:        General: Tenderness present.     Cervical back: Normal range of motion and neck supple.     Comments: TTP over medial and lateral joint line, medial worse than lateral.  No calf tenderness, swelling, or erythema.  No overlying lesions of area of chief complaint.   Decreased strength and ROM due to elicited pain.  Dorsiflexion and plantarflexion intact.  Stable to varus and valgus stress.  BLE appear grossly neurovascularly intact.  Gait mildly antalgic.   Skin:    General: Skin is warm and dry.     Capillary Refill: Capillary refill takes less than  2 seconds.     Findings: No erythema or rash.  Neurological:     General: No focal deficit present.     Mental Status: He is alert and oriented to person, place, and time.  Psychiatric:        Mood and Affect: Mood normal.        Behavior: Behavior normal.     Vital signs in last 24 hours: @VSRANGES @  Labs:   Estimated body mass index is 281.6 kg/m as calculated from the following:   Height as of 11/28/23: 2' (0.61 m).   Weight as of 11/28/23: 104.6 kg.   Imaging Review Plain radiographs demonstrate severe degenerative joint disease of the left knee(s). The overall alignment issignificant varus. The bone quality appears to be fair for age and reported activity level.      Assessment/Plan:  End stage arthritis, left knee   The patient history, physical examination, clinical judgment of the provider and imaging studies are consistent with end stage degenerative joint disease of the left knee(s) and total knee arthroplasty is deemed medically necessary. The treatment options including medical management, injection therapy arthroscopy and arthroplasty were discussed at length. The risks and benefits of total knee arthroplasty were presented and reviewed. The risks due to aseptic loosening, infection, stiffness, patella tracking problems, thromboembolic complications and other imponderables were discussed. The patient acknowledged the explanation, agreed to proceed with the plan and consent was signed. Patient is being admitted for inpatient treatment for surgery, pain control, PT, OT, prophylactic antibiotics, VTE prophylaxis, progressive ambulation and ADL's and discharge planning. The patient is  planning to be discharged home with OPPT     Patient's anticipated LOS is less than 2 midnights, meeting these requirements: - Lives within 1 hour of care - Has a competent adult at home to recover with post-op recover - NO history of  - Chronic pain requiring opiods  - Diabetes  - Heart failure  - Heart attack  - Stroke  - DVT/VTE  - Cardiac arrhythmia  - Respiratory Failure/COPD  - Renal failure  - Anemia  - Advanced Liver disease      [1]  Allergies Allergen Reactions   Lipitor [Atorvastatin Calcium ]     UNSPECIFIED REACTION    Norvasc [Amlodipine Besylate]     UNSPECIFIED REACTION    Prinivil [Lisinopril]     UNSPECIFIED REACTION

## 2024-02-02 NOTE — Progress Notes (Signed)
 Sent message, via epic in basket, requesting orders in epic from Careers adviser.

## 2024-02-02 NOTE — H&P (View-Only) (Signed)
 TOTAL KNEE ADMISSION H&P  Patient is being admitted for left total knee arthroplasty.  Subjective:  Chief Complaint:left knee pain.  HPI: Tyrone Sherman, 74 y.o. male, has a history of pain and functional disability in the left knee due to arthritis and has failed non-surgical conservative treatments for greater than 12 weeks to includeNSAID's and/or analgesics, corticosteriod injections, use of assistive devices, and activity modification.  Onset of symptoms was gradual, starting several years ago with gradually worsening course since that time. The patient noted no past surgery on the left knee(s).  Patient currently rates pain in the left knee(s) at 9 out of 10 with activity. Patient has night pain, worsening of pain with activity and weight bearing, pain that interferes with activities of daily living, and pain with passive range of motion.  Patient has evidence of periarticular osteophytes and joint space narrowing by imaging studies.  There is no active infection.  Patient Active Problem List   Diagnosis Date Noted   S/P CABG x 6 05/24/2017   CAD (coronary artery disease)    Essential hypertension    Abnormal stress test 05/20/2017   Hyperlipidemia    Obstructive sleep apnea on CPAP    Allergic rhinitis    Trigger thumb of right hand    DJD (degenerative joint disease)    GERD (gastroesophageal reflux disease)    Ventral hernia    Past Medical History:  Diagnosis Date   Allergic rhinitis    Benign essential hypertension    Chest tightness    EXERTIONAL   DJD (degenerative joint disease)    GERD (gastroesophageal reflux disease)    Hyperlipidemia    Hypertension    Metatarsal fracture 2012   LEFT FOOT   Obstructive sleep apnea on CPAP    Onychomycosis    Trigger thumb of right hand    Ventral hernia    Ventral hernia     Past Surgical History:  Procedure Laterality Date   CORONARY ARTERY BYPASS GRAFT N/A 05/24/2017   Procedure: CORONARY ARTERY BYPASS GRAFTING (CABG);   Surgeon: Army Dallas NOVAK, MD;  Location: [facility] OR;  Service: Open Heart Surgery;  Laterality: N/A;  Times 6 using left internal mammary artery to LAD and endoscopically harvested right saphenous vein to Diagonal, OM1, OM2, Acute Marginal, and PDA   Incarcerated umbilical hernia repair     LEFT HEART CATH AND CORONARY ANGIOGRAPHY N/A 05/20/2017   Procedure: LEFT HEART CATH AND CORONARY ANGIOGRAPHY;  Surgeon: Dann Candyce RAMAN, MD;  Location: [facility] INVASIVE CV LAB;  Service: Cardiovascular;  Laterality: N/A;   Left metatarsal fracture repair     Left varicocele repair 1973     TEE WITHOUT CARDIOVERSION N/A 05/24/2017   Procedure: TRANSESOPHAGEAL ECHOCARDIOGRAM (TEE);  Surgeon: Army Dallas NOVAK, MD;  Location: [facility] OR;  Service: Open Heart Surgery;  Laterality: N/A;    Current Outpatient Medications  Medication Sig Dispense Refill Last Dose/Taking   acetaminophen  (TYLENOL ) 325 MG tablet Take 2 tablets (650 mg total) by mouth every 6 (six) hours as needed for mild pain.      Alirocumab  (PRALUENT ) 75 MG/ML SOAJ INJECT 1  SUBCUTANEOUSLY EVERY TWO WEEKS 6 mL 3    ALPRAZolam  (XANAX ) 0.25 MG tablet Take 0.25 mg by mouth at bedtime as needed for anxiety.      Ascorbic Acid (VITAMIN C) 100 MG tablet Take 100 mg by mouth daily.      aspirin  EC 81 MG tablet Take 81 mg by mouth daily.  Cholecalciferol (VITAMIN D3) 2000 units TABS Take 2,000 Units by mouth daily.      citalopram  (CELEXA ) 40 MG tablet Take 40 mg by mouth daily.      ezetimibe  (ZETIA ) 10 MG tablet Take 1 tablet (10 mg total) by mouth daily. 90 tablet 3    folic acid  (FOLVITE ) 1 MG tablet Take 1 mg by mouth daily.      Garlic 500 MG CAPS Take 500 mg by mouth.      metoprolol  tartrate (LOPRESSOR ) 25 MG tablet Take 1/2 (one-half) tablet by mouth twice daily. 90 tablet 3    nitroGLYCERIN  (NITROSTAT ) 0.4 MG SL tablet 1 tablet sublingually Every 5 minutes as needed for chest pain not relieved with rest 30 days 25 tablet 3    Omega 3 1200 MG CAPS  Take 1,200 mg by mouth daily.      omeprazole (PRILOSEC) 20 MG capsule Take 20 mg by mouth 2 (two) times daily before a meal.      rosuvastatin  (CRESTOR ) 10 MG tablet Take 10 mg by mouth daily.      valsartan-hydrochlorothiazide  (DIOVAN-HCT) 80-12.5 MG tablet Take 1 tablet by mouth daily.      vitamin E 100 UNIT capsule Take 100 Units by mouth daily.       No current facility-administered medications for this visit.   Allergies[1]  Social History   Tobacco Use   Smoking status: Never    Passive exposure: Never   Smokeless tobacco: Never  Substance Use Topics   Alcohol use: Never    Family History  Problem Relation Age of Onset   CAD Mother    Other Mother        CABG x8 VESSELS   CAD Father    Other Father        7 VESSEL BYPASS    Hypertension Brother 33   CAD Brother    Hypertension Brother 26   CAD Brother    Hypertension Brother 1   CAD Brother      Review of Systems  Musculoskeletal:  Positive for arthralgias.  All other systems reviewed and are negative.   Objective:  Physical Exam Constitutional:      General: He is not in acute distress.    Appearance: Normal appearance. He is normal weight.  HENT:     Head: Normocephalic and atraumatic.  Eyes:     Extraocular Movements: Extraocular movements intact.     Conjunctiva/sclera: Conjunctivae normal.     Pupils: Pupils are equal, round, and reactive to light.  Cardiovascular:     Rate and Rhythm: Normal rate and regular rhythm.     Pulses: Normal pulses.  Pulmonary:     Effort: Pulmonary effort is normal. No respiratory distress.  Abdominal:     General: There is no distension.     Palpations: Abdomen is soft.  Musculoskeletal:        General: Tenderness present.     Cervical back: Normal range of motion and neck supple.     Comments: TTP over medial and lateral joint line, medial worse than lateral.  No calf tenderness, swelling, or erythema.  No overlying lesions of area of chief complaint.   Decreased strength and ROM due to elicited pain.  Dorsiflexion and plantarflexion intact.  Stable to varus and valgus stress.  BLE appear grossly neurovascularly intact.  Gait mildly antalgic.   Skin:    General: Skin is warm and dry.     Capillary Refill: Capillary refill takes less than  2 seconds.     Findings: No erythema or rash.  Neurological:     General: No focal deficit present.     Mental Status: He is alert and oriented to person, place, and time.  Psychiatric:        Mood and Affect: Mood normal.        Behavior: Behavior normal.     Vital signs in last 24 hours: @VSRANGES @  Labs:   Estimated body mass index is 281.6 kg/m as calculated from the following:   Height as of 11/28/23: 2' (0.61 m).   Weight as of 11/28/23: 104.6 kg.   Imaging Review Plain radiographs demonstrate severe degenerative joint disease of the left knee(s). The overall alignment issignificant varus. The bone quality appears to be fair for age and reported activity level.      Assessment/Plan:  End stage arthritis, left knee   The patient history, physical examination, clinical judgment of the provider and imaging studies are consistent with end stage degenerative joint disease of the left knee(s) and total knee arthroplasty is deemed medically necessary. The treatment options including medical management, injection therapy arthroscopy and arthroplasty were discussed at length. The risks and benefits of total knee arthroplasty were presented and reviewed. The risks due to aseptic loosening, infection, stiffness, patella tracking problems, thromboembolic complications and other imponderables were discussed. The patient acknowledged the explanation, agreed to proceed with the plan and consent was signed. Patient is being admitted for inpatient treatment for surgery, pain control, PT, OT, prophylactic antibiotics, VTE prophylaxis, progressive ambulation and ADL's and discharge planning. The patient is  planning to be discharged home with OPPT     Patient's anticipated LOS is less than 2 midnights, meeting these requirements: - Lives within 1 hour of care - Has a competent adult at home to recover with post-op recover - NO history of  - Chronic pain requiring opiods  - Diabetes  - Heart failure  - Heart attack  - Stroke  - DVT/VTE  - Cardiac arrhythmia  - Respiratory Failure/COPD  - Renal failure  - Anemia  - Advanced Liver disease      [1]  Allergies Allergen Reactions   Lipitor [Atorvastatin Calcium ]     UNSPECIFIED REACTION    Norvasc [Amlodipine Besylate]     UNSPECIFIED REACTION    Prinivil [Lisinopril]     UNSPECIFIED REACTION

## 2024-02-07 NOTE — Patient Instructions (Signed)
 SURGICAL WAITING ROOM VISITATION  Patients having surgery or a procedure may have no more than 2 support people in the waiting area - these visitors may rotate.    Children ages 60 and under will not be able to visit patients in [facility] under most circumstances.   Visitors with respiratory illnesses are discouraged from visiting and should remain at home.  If the patient needs to stay at the hospital during part of their recovery, the visitor guidelines for inpatient rooms apply. Pre-op nurse will coordinate an appropriate time for 1 support person to accompany patient in pre-op.  This support person may not rotate.    Please refer to the [facility] website for the visitor guidelines for Inpatients (after your surgery is over and you are in a regular room).       Your procedure is scheduled on:   02/15/24    Report to [facility] Main Entrance    Report to admitting at   [phone]   Call this number if you have problems the morning of surgery [phone]   Do not eat food :After Midnight.   After Midnight you may have the following liquids until ___ 0430___ AM  DAY OF SURGERY  Water Non-Citrus Juices (without pulp, NO RED-Apple, White grape, White cranberry) Black Coffee (NO MILK/CREAM OR CREAMERS, sugar ok)  Clear Tea (NO MILK/CREAM OR CREAMERS, sugar ok) regular and decaf                             Plain Jell-O (NO RED)                                           Fruit ices (not with fruit pulp, NO RED)                                     Popsicles (NO RED)                                                               Sports drinks like Gatorade (NO RED)                    The day of surgery:  Drink ONE (1) Pre-Surgery Clear Ensure or G2 at 0430 AM the morning of surgery. Drink in one sitting. Do not sip.  This drink was given to you during your hospital  pre-op appointment visit. Nothing else to drink after completing the  Pre-Surgery Clear Ensure or  G2.          If you have questions, please contact your surgeons office.      Oral Hygiene is also important to reduce your risk of infection.                                    Remember - BRUSH YOUR TEETH THE MORNING OF SURGERY WITH YOUR REGULAR TOOTHPASTE  DENTURES WILL BE REMOVED PRIOR TO SURGERY PLEASE DO NOT APPLY  Poly grip OR ADHESIVES!!!   Do NOT smoke after Midnight   Stop all vitamins and herbal supplements 7 days before surgery.   Take these medicines the morning of surgery with A SIP OF WATER:  celexa , metoprolol , omeprazole   DO NOT TAKE ANY ORAL DIABETIC MEDICATIONS DAY OF YOUR SURGERY  Bring CPAP mask and tubing day of surgery.                              You may not have any metal on your body including hair pins, jewelry, and body piercing             Do not wear make-up, lotions, powders, perfumes/cologne, or deodorant  Do not wear nail polish including gel and S&S, artificial/acrylic nails, or any other type of covering on natural nails including finger and toenails. If you have artificial nails, gel coating, etc. that needs to be removed by a nail salon please have this removed prior to surgery or surgery may need to be canceled/ delayed if the surgeon/ anesthesia feels like they are unable to be safely monitored.   Do not shave  48 hours prior to surgery.               Men may shave face and neck.   Do not bring valuables to the hospital. [facility] IS NOT             RESPONSIBLE   FOR VALUABLES.   Contacts, glasses, dentures or bridgework may not be worn into surgery.   Bring small overnight bag day of surgery.   DO NOT BRING YOUR HOME MEDICATIONS TO THE HOSPITAL. PHARMACY WILL DISPENSE MEDICATIONS LISTED ON YOUR MEDICATION LIST TO YOU DURING YOUR ADMISSION IN THE HOSPITAL!    Patients discharged on the day of surgery will not be allowed to drive home.  Someone NEEDS to stay with you for the first 24 hours after anesthesia.   Special Instructions:  Bring a copy of your healthcare power of attorney and living will documents the day of surgery if you haven't scanned them before.              Please read over the following fact sheets you were given: IF YOU HAVE QUESTIONS ABOUT YOUR PRE-OP INSTRUCTIONS PLEASE CALL [phone].   If you received a COVID test during your pre-op visit  it is requested that you wear a mask when out in public, stay away from anyone that may not be feeling well and notify your surgeon if you develop symptoms. If you test positive for Covid or have been in contact with anyone that has tested positive in the last 10 days please notify you surgeon.      Pre-operative 4 CHG Bath Instructions   You can play a key role in reducing the risk of infection after surgery. Your skin needs to be as free of germs as possible. You can reduce the number of germs on your skin by washing with CHG (chlorhexidine  gluconate) soap before surgery. CHG is an antiseptic soap that kills germs and continues to kill germs even after washing.   DO NOT use if you have an allergy to chlorhexidine /CHG or antibacterial soaps. If your skin becomes reddened or irritated, stop using the CHG and notify one of our RNs at [phone].   Please shower with the CHG soap starting 4 days before surgery using the following schedule:     Please  keep in mind the following:  DO NOT shave, including legs and underarms, starting the day of your first shower.   You may shave your face at any point before/day of surgery.  Place clean sheets on your bed the day you start using CHG soap. Use a clean washcloth (not used since being washed) for each shower. DO NOT sleep with pets once you start using the CHG.   CHG Shower Instructions:  If you choose to wash your hair and private area, wash first with your normal shampoo/soap.  After you use shampoo/soap, rinse your hair and body thoroughly to remove shampoo/soap residue.  Turn the water OFF and apply about 3  tablespoons (45 ml) of CHG soap to a CLEAN washcloth.  Apply CHG soap ONLY FROM YOUR NECK DOWN TO YOUR TOES (washing for 3-5 minutes)  DO NOT use CHG soap on face, private areas, open wounds, or sores.  Pay special attention to the area where your surgery is being performed.  If you are having back surgery, having someone wash your back for you may be helpful. Wait 2 minutes after CHG soap is applied, then you may rinse off the CHG soap.  Pat dry with a clean towel  Put on clean clothes/pajamas   If you choose to wear lotion, please use ONLY the CHG-compatible lotions on the back of this paper.     Additional instructions for the day of surgery: DO NOT APPLY any lotions, deodorants, cologne, or perfumes.   Put on clean/comfortable clothes.  Brush your teeth.  Ask your nurse before applying any prescription medications to the skin.      CHG Compatible Lotions   Aveeno Moisturizing lotion  Cetaphil Moisturizing Cream  Cetaphil Moisturizing Lotion  Clairol Herbal Essence Moisturizing Lotion, Dry Skin  Clairol Herbal Essence Moisturizing Lotion, Extra Dry Skin  Clairol Herbal Essence Moisturizing Lotion, Normal Skin  Curel Age Defying Therapeutic Moisturizing Lotion with Alpha Hydroxy  Curel Extreme Care Body Lotion  Curel Soothing Hands Moisturizing Hand Lotion  Curel Therapeutic Moisturizing Cream, Fragrance-Free  Curel Therapeutic Moisturizing Lotion, Fragrance-Free  Curel Therapeutic Moisturizing Lotion, Original Formula  Eucerin Daily Replenishing Lotion  Eucerin Dry Skin Therapy Plus Alpha Hydroxy Crme  Eucerin Dry Skin Therapy Plus Alpha Hydroxy Lotion  Eucerin Original Crme  Eucerin Original Lotion  Eucerin Plus Crme Eucerin Plus Lotion  Eucerin TriLipid Replenishing Lotion  Keri Anti-Bacterial Hand Lotion  Keri Deep Conditioning Original Lotion Dry Skin Formula Softly Scented  Keri Deep Conditioning Original Lotion, Fragrance Free Sensitive Skin Formula  Keri  Lotion Fast Absorbing Fragrance Free Sensitive Skin Formula  Keri Lotion Fast Absorbing Softly Scented Dry Skin Formula  Keri Original Lotion  Keri Skin Renewal Lotion Keri Silky Smooth Lotion  Keri Silky Smooth Sensitive Skin Lotion  Nivea Body Creamy Conditioning Oil  Nivea Body Extra Enriched Teacher, Adult Education Moisturizing Lotion Nivea Crme  Nivea Skin Firming Lotion  NutraDerm 30 Skin Lotion  NutraDerm Skin Lotion  NutraDerm Therapeutic Skin Cream  NutraDerm Therapeutic Skin Lotion  ProShield Protective Hand Cream  Provon moisturizing lotion

## 2024-02-07 NOTE — Progress Notes (Signed)
 Anesthesia Review:  PCP: Cardiologist : Reche Vannie PIETY LVO 11/28/23   PPM/ ICD: Device Orders: Rep Notified:  Chest x-ray : EKG : 11/28/23  Echo : 2019  Stress test:2019  Cardiac Cath :  2019   Activity level:  Sleep Study/ CPAP : Fasting Blood Sugar :      / Checks Blood Sugar -- times a day:    Blood Thinner/ Instructions /Last Dose: ASA / Instructions/ Last Dose :    81 mg aspirin 

## 2024-02-09 ENCOUNTER — Encounter (HOSPITAL_COMMUNITY)
Admission: RE | Admit: 2024-02-09 | Discharge: 2024-02-09 | Disposition: A | Discharge status: Home / Self Care | Payer: Medicare (Managed Care) | Source: Ambulatory Visit | Attending: Orthopedic Surgery | Admitting: Orthopedic Surgery

## 2024-02-09 ENCOUNTER — Encounter (HOSPITAL_COMMUNITY): Payer: Self-pay

## 2024-02-09 ENCOUNTER — Other Ambulatory Visit: Payer: Self-pay

## 2024-02-09 VITALS — BP 153/71 | HR 74 | Temp 99.0°F | Resp 16 | Ht 69.0 in | Wt 235.0 lb

## 2024-02-09 DIAGNOSIS — G8929 Other chronic pain: Secondary | ICD-10-CM | POA: Insufficient documentation

## 2024-02-09 DIAGNOSIS — M25562 Pain in left knee: Secondary | ICD-10-CM | POA: Diagnosis not present

## 2024-02-09 DIAGNOSIS — Z01812 Encounter for preprocedural laboratory examination: Secondary | ICD-10-CM | POA: Insufficient documentation

## 2024-02-09 DIAGNOSIS — Z951 Presence of aortocoronary bypass graft: Secondary | ICD-10-CM | POA: Insufficient documentation

## 2024-02-09 DIAGNOSIS — M1712 Unilateral primary osteoarthritis, left knee: Secondary | ICD-10-CM | POA: Diagnosis not present

## 2024-02-09 DIAGNOSIS — K219 Gastro-esophageal reflux disease without esophagitis: Secondary | ICD-10-CM | POA: Insufficient documentation

## 2024-02-09 DIAGNOSIS — Z01818 Encounter for other preprocedural examination: Secondary | ICD-10-CM | POA: Diagnosis present

## 2024-02-09 DIAGNOSIS — G4733 Obstructive sleep apnea (adult) (pediatric): Secondary | ICD-10-CM | POA: Insufficient documentation

## 2024-02-09 DIAGNOSIS — F419 Anxiety disorder, unspecified: Secondary | ICD-10-CM | POA: Diagnosis not present

## 2024-02-09 DIAGNOSIS — I2510 Atherosclerotic heart disease of native coronary artery without angina pectoris: Secondary | ICD-10-CM | POA: Diagnosis not present

## 2024-02-09 DIAGNOSIS — I10 Essential (primary) hypertension: Secondary | ICD-10-CM | POA: Diagnosis not present

## 2024-02-09 HISTORY — DX: Atherosclerotic heart disease of native coronary artery without angina pectoris: I25.10

## 2024-02-09 LAB — COMPREHENSIVE METABOLIC PANEL WITH GFR
ALT: 35 U/L (ref 0–44)
AST: 26 U/L (ref 15–41)
Albumin: 4.2 g/dL (ref 3.5–5.0)
Alkaline Phosphatase: 45 U/L (ref 38–126)
Anion gap: 10 (ref 5–15)
BUN: 17 mg/dL (ref 8–23)
CO2: 25 mmol/L (ref 22–32)
Calcium: 9.4 mg/dL (ref 8.9–10.3)
Chloride: 103 mmol/L (ref 98–111)
Creatinine, Ser: 0.75 mg/dL (ref 0.61–1.24)
GFR, Estimated: >60 mL/min
Glucose, Bld: 131 mg/dL — ABNORMAL HIGH (ref 70–99)
Potassium: 4.6 mmol/L (ref 3.5–5.1)
Sodium: 138 mmol/L (ref 135–145)
Total Bilirubin: 0.3 mg/dL (ref 0.0–1.2)
Total Protein: 6.8 g/dL (ref 6.5–8.1)

## 2024-02-09 LAB — CBC WITH DIFFERENTIAL/PLATELET
Abs Immature Granulocytes: 0.04 K/uL (ref 0.00–0.07)
Basophils Absolute: 0.1 K/uL (ref 0.0–0.1)
Basophils Relative: 1 %
Eosinophils Absolute: 0.4 K/uL (ref 0.0–0.5)
Eosinophils Relative: 6 %
HCT: 47 % (ref 39.0–52.0)
Hemoglobin: 14.7 g/dL (ref 13.0–17.0)
Immature Granulocytes: 1 %
Lymphocytes Relative: 32 %
Lymphs Abs: 2.2 K/uL (ref 0.7–4.0)
MCH: 27.6 pg (ref 26.0–34.0)
MCHC: 31.3 g/dL (ref 30.0–36.0)
MCV: 88.2 fL (ref 80.0–100.0)
Monocytes Absolute: 0.7 K/uL (ref 0.1–1.0)
Monocytes Relative: 10 %
Neutro Abs: 3.5 K/uL (ref 1.7–7.7)
Neutrophils Relative %: 50 %
Platelets: 285 K/uL (ref 150–400)
RBC: 5.33 MIL/uL (ref 4.22–5.81)
RDW: 14.1 % (ref 11.5–15.5)
WBC: 6.9 K/uL (ref 4.0–10.5)
nRBC: 0 % (ref 0.0–0.2)

## 2024-02-09 LAB — SURGICAL PCR SCREEN
MRSA, PCR: NEGATIVE
Staphylococcus aureus: NEGATIVE

## 2024-02-10 ENCOUNTER — Encounter (HOSPITAL_COMMUNITY): Payer: Self-pay

## 2024-02-10 NOTE — Anesthesia Preprocedure Evaluation (Addendum)
 "                                  Anesthesia Evaluation  Patient identified by MRN, date of birth, ID band Patient awake    Reviewed: Allergy & Precautions, NPO status , Patient's Chart, lab work & pertinent test results  History of Anesthesia Complications Negative for: history of anesthetic complications  Airway Mallampati: II  TM Distance: >3 FB Neck ROM: Full    Dental no notable dental hx. (+) Teeth Intact   Pulmonary sleep apnea and Continuous Positive Airway Pressure Ventilation , neg COPD, Patient abstained from smoking.Not current smoker   Pulmonary exam normal breath sounds clear to auscultation       Cardiovascular Exercise Tolerance: Good METShypertension, Pt. on medications + CAD and + CABG  (-) Past MI (-) dysrhythmias  Rhythm:Regular Rate:Normal - Systolic murmurs    Neuro/Psych negative neurological ROS  negative psych ROS   GI/Hepatic ,GERD  Medicated and Controlled,,(+)     (-) substance abuse    Endo/Other  neg diabetes    Renal/GU negative Renal ROS     Musculoskeletal  (+) Arthritis ,    Abdominal   Peds  Hematology Denies blood thinner use   Anesthesia Other Findings Tyrone Sherman is a 74 yo male with PMH of HTN, CAD s/p CABG x 4 (05/2017), OSA on CPAP, GERD, arthritis, anxiety.   Patient follows with Cardiology for hx of CAD s/p CABG. Last seen by NP Walker on 11/28/23 for pre op clearance. Patient denied cardiac symptoms. Advised to continue current medicines and f/u in 1 year. Cleared in 01/23/24 telephone note:   Chart reviewed as part of pre-operative protocol coverage. Given past medical history and time since last visit, based on ACC/AHA guidelines, Tyrone Sherman is at acceptable risk for the planned procedure without further cardiovascular testing.       VS: BP (!) 153/71   Pulse 74   Temp 37.2 C (Oral)   Resp 16   Ht 5' 9 (1.753 m)   Wt 106.6 kg   SpO2 96%   BMI 34.70 kg/m    PROVIDERS: [place],  [place]  E, PA     LABS: Labs reviewed: Acceptable for surgery. (all labs ordered are listed, but only abnormal results are displayed)   Labs Reviewed COMPREHENSIVE METABOLIC PANEL WITH GFR - Abnormal; Notable for the following components:     Result Value    Glucose, Bld 131 (*)     All other components within normal limits SURGICAL PCR SCREEN CBC WITH DIFFERENTIAL/PLATELET TYPE AND SCREEN     EKG 11/28/23:   NSR RAD Nonspecific T wave abnormality No acute ST/T wave changes     TEE 05/24/2017:      Aortic valve: The valve is trileaflet. No stenosis. No regurgitation.  Mitral valve: Mild regurgitation with central jet.  Right ventricle: Normal cavity size, wall thickness and ejection fraction.  Left ventricle: Normal LVEF 50-55%, no regional wall motion abnormalities.  Aorta: No dilation, minimal plaque burden and calcification.   Post Bypass:   No acute changes to Tricuspid, Pulmonic, Mitral and Aortic Valve. LVEF 55-60% with dopamine , CO > 6.0L. No dissection noted after aortic cannula removed.     Reproductive/Obstetrics                              Anesthesia Physical Anesthesia  Plan  ASA: 3  Anesthesia Plan: Spinal   Post-op Pain Management: Regional block* and Tylenol  PO (pre-op)*   Induction: Intravenous  PONV Risk Score and Plan: 1 and Ondansetron , Dexamethasone , Propofol  infusion, TIVA and Midazolam   Airway Management Planned: Natural Airway  Additional Equipment: None  Intra-op Plan:   Post-operative Plan:   Informed Consent: I have reviewed the patients History and Physical, chart, labs and discussed the procedure including the risks, benefits and alternatives for the proposed anesthesia with the patient or authorized representative who has indicated his/her understanding and acceptance.       Plan Discussed with: CRNA and Surgeon  Anesthesia Plan Comments: (Discussed R/B/A of neuraxial anesthesia technique  with patient: - rare risks of spinal/epidural hematoma, nerve damage, infection - Risk of PDPH - Risk of nausea and vomiting - Risk of conversion to general anesthesia and its associated risks, including sore throat, damage to lips/eyes/teeth/oropharynx, and rare risks such as cardiac and respiratory events. - Risk of allergic reactions  Discussed r/b/a of adductor canal nerve block, including:  - bleeding, infection, nerve damage - poor or non functioning block. - reactions and toxicity to local anesthetic Patient understands.  Discussed the role of CRNA in patient's perioperative care.  Patient voiced understanding.)         Anesthesia Quick Evaluation  "

## 2024-02-10 NOTE — Progress Notes (Signed)
 " Case: [id number] Date/Time: 02/15/24 0715   Procedure: ARTHROPLASTY, KNEE, TOTAL (Left: Knee)   Anesthesia type: Spinal   Pre-op diagnosis: OA LEFT KNEE   Location: [facility] ROOM 10 / [facility]   Surgeons: Edna Toribio LABOR, MD       DISCUSSION: Tyrone Sherman is a 74 yo male with PMH of HTN, CAD s/p CABG x 4 (05/2017), OSA on CPAP, GERD, arthritis, anxiety.  Patient follows with Cardiology for hx of CAD s/p CABG. Last seen by NP Walker on 11/28/23 for pre op clearance. Patient denied cardiac symptoms. Advised to continue current medicines and f/u in 1 year. Cleared in 01/23/24 telephone note:  Chart reviewed as part of pre-operative protocol coverage. Given past medical history and time since last visit, based on ACC/AHA guidelines, Tyrone Sherman is at acceptable risk for the planned procedure without further cardiovascular testing.    VS: BP (!) 153/71   Pulse 74   Temp 37.2 C (Oral)   Resp 16   Ht 5' 9 (1.753 m)   Wt 106.6 kg   SpO2 96%   BMI 34.70 kg/m   PROVIDERS: Cle[place]ilde, [place]  E, PA   LABS: Labs reviewed: Acceptable for surgery. (all labs ordered are listed, but only abnormal results are displayed)  Labs Reviewed  COMPREHENSIVE METABOLIC PANEL WITH GFR - Abnormal; Notable for the following components:      Result Value   Glucose, Bld 131 (*)    All other components within normal limits  SURGICAL PCR SCREEN  CBC WITH DIFFERENTIAL/PLATELET  TYPE AND SCREEN    EKG 11/28/23:  NSR RAD Nonspecific T wave abnormality No acute ST/T wave changes   TEE 05/24/2017:    Aortic valve: The valve is trileaflet. No stenosis. No regurgitation.  Mitral valve: Mild regurgitation with central jet.  Right ventricle: Normal cavity size, wall thickness and ejection fraction.  Left ventricle: Normal LVEF 50-55%, no regional wall motion abnormalities.  Aorta: No dilation, minimal plaque burden and calcification.   Post Bypass:  No acute changes to Tricuspid, Pulmonic,  Mitral and Aortic Valve. LVEF 55-60% with dopamine , CO > 6.0L. No dissection noted after aortic cannula removed.    Echo 05/21/2017:  Study Conclusions  - Left ventricle: The cavity size was normal. Wall thickness was   increased in a pattern of mild LVH. Systolic function was normal.   The estimated ejection fraction was in the range of 60% to 65%.   Wall motion was normal; there were no regional wall motion   abnormalities. Doppler parameters are consistent with abnormal   left ventricular relaxation (grade 1 diastolic dysfunction).   Indeterminate filling pressures. - Mitral valve: There was mild regurgitation. - Right ventricle: The cavity size was mildly dilated. Wall   thickness was normal. - Atrial septum: No defect or patent foramen ovale was identified.   LHC 05/20/2017:   Prox LAD lesion is 80% stenosed. Ost 1st Diag lesion is 80% stenosed. Mid Cx lesion is 95% stenosed. Ost 2nd Mrg lesion is 70% stenosed. Mid RCA lesion is 90% stenosed. Post Atrio lesion is 90% stenosed. The left ventricular ejection fraction is 55-65% by visual estimate. The left ventricular systolic function is normal. LV end diastolic pressure is normal. There is no aortic valve stenosis.   Severe three vessel CAD.  Given accelerating symptoms and angina at rest, will admit for inpatient TCTS consult.  Start heparin  8 hours post sheath pull.    Past Medical History:  Diagnosis Date   Allergic rhinitis  Benign essential hypertension    Chest tightness    EXERTIONAL   Coronary artery disease    DJD (degenerative joint disease)    GERD (gastroesophageal reflux disease)    Hyperlipidemia    Hypertension    Metatarsal fracture 2012   LEFT FOOT   Obstructive sleep apnea on CPAP    Onychomycosis    Trigger thumb of right hand    Ventral hernia    Ventral hernia     Past Surgical History:  Procedure Laterality Date   CORONARY ARTERY BYPASS GRAFT N/A 05/24/2017   Procedure: CORONARY ARTERY  BYPASS GRAFTING (CABG);  Surgeon: Army Dallas NOVAK, MD;  Location: [facility] OR;  Service: Open Heart Surgery;  Laterality: N/A;  Times 6 using left internal mammary artery to LAD and endoscopically harvested right saphenous vein to Diagonal, OM1, OM2, Acute Marginal, and PDA   Incarcerated umbilical hernia repair     LEFT HEART CATH AND CORONARY ANGIOGRAPHY N/A 05/20/2017   Procedure: LEFT HEART CATH AND CORONARY ANGIOGRAPHY;  Surgeon: Dann Candyce RAMAN, MD;  Location: [facility] INVASIVE CV LAB;  Service: Cardiovascular;  Laterality: N/A;   Left metatarsal fracture repair     Left varicocele repair 1973     TEE WITHOUT CARDIOVERSION N/A 05/24/2017   Procedure: TRANSESOPHAGEAL ECHOCARDIOGRAM (TEE);  Surgeon: Army Dallas NOVAK, MD;  Location: [facility] OR;  Service: Open Heart Surgery;  Laterality: N/A;    MEDICATIONS:  acetaminophen  (TYLENOL ) 325 MG tablet   Alirocumab  (PRALUENT ) 75 MG/ML SOAJ   ALPRAZolam  (XANAX ) 0.25 MG tablet   ascorbic acid (VITAMIN C) 500 MG tablet   aspirin  EC 81 MG tablet   Cholecalciferol (VITAMIN D3) 2000 units TABS   citalopram  (CELEXA ) 40 MG tablet   ezetimibe  (ZETIA ) 10 MG tablet   folic acid  (FOLVITE ) 1 MG tablet   Garlic 500 MG CAPS   metoprolol  tartrate (LOPRESSOR ) 25 MG tablet   nitroGLYCERIN  (NITROSTAT ) 0.4 MG SL tablet   Omega 3 1200 MG CAPS   omeprazole (PRILOSEC) 20 MG capsule   rosuvastatin  (CRESTOR ) 10 MG tablet   valsartan-hydrochlorothiazide  (DIOVAN-HCT) 80-12.5 MG tablet   vitamin E 180 MG (400 UNITS) capsule   No current facility-administered medications for this encounter.   Burnard CHRISTELLA Odis DEVONNA [facility] Surgical Short Stay/Anesthesiology [facility] Phone [phone] 02/10/2024 2:34 PM        "

## 2024-02-14 ENCOUNTER — Telehealth: Payer: Self-pay | Admitting: Family

## 2024-02-14 NOTE — Telephone Encounter (Signed)
 Spoke with patient who stated he has new Research Scientist (medical) like previously switched from repatha  to praluent  for insurance cost his current plan seems to prefer repatha  over praluent  repatha  $45 per month or $141 for 90 days he does have a $295 Rx deductible. so his initial Rx will be $350 then $45 thereafter   Reviewed information with patient who is agreeable to switching if needed   Will forward to Rx PA team for review/PA if required

## 2024-02-14 NOTE — Telephone Encounter (Signed)
 Pt c/o medication issue:  1. Name of Medication:   Alirocumab  (PRALUENT ) 75 MG/ML SOAJ    2. How are you currently taking this medication (dosage and times per day)?  INJECT 1 SUBCUTANEOUSLY EVERY TWO WEEKS       3. Are you having a reaction (difficulty breathing--STAT)? No  4. What is your medication issue? Pt is requesting a callback regarding this medication being over $800.00 since his grant has now expired. Pt would like to discuss. Please advise

## 2024-02-15 ENCOUNTER — Observation Stay (HOSPITAL_COMMUNITY): Payer: Medicare (Managed Care)

## 2024-02-15 ENCOUNTER — Encounter (HOSPITAL_COMMUNITY): Payer: Self-pay | Admitting: Medical

## 2024-02-15 ENCOUNTER — Other Ambulatory Visit (HOSPITAL_COMMUNITY): Payer: Self-pay

## 2024-02-15 ENCOUNTER — Encounter (HOSPITAL_COMMUNITY): Admission: RE | Disposition: A | Payer: Self-pay | Source: Ambulatory Visit | Attending: Orthopedic Surgery

## 2024-02-15 ENCOUNTER — Observation Stay (HOSPITAL_COMMUNITY)
Admission: RE | Admit: 2024-02-15 | Discharge: 2024-02-16 | Disposition: A | Discharge status: Home / Self Care | Payer: Self-pay | Source: Ambulatory Visit | Attending: Orthopedic Surgery | Admitting: Orthopedic Surgery

## 2024-02-15 ENCOUNTER — Encounter (HOSPITAL_COMMUNITY): Payer: Self-pay | Admitting: Orthopedic Surgery

## 2024-02-15 ENCOUNTER — Ambulatory Visit (HOSPITAL_BASED_OUTPATIENT_CLINIC_OR_DEPARTMENT_OTHER): Payer: Self-pay | Admitting: Anesthesiology

## 2024-02-15 ENCOUNTER — Telehealth: Payer: Self-pay | Admitting: Pharmacy Technician

## 2024-02-15 ENCOUNTER — Other Ambulatory Visit: Payer: Self-pay

## 2024-02-15 DIAGNOSIS — E785 Hyperlipidemia, unspecified: Secondary | ICD-10-CM

## 2024-02-15 DIAGNOSIS — M1712 Unilateral primary osteoarthritis, left knee: Secondary | ICD-10-CM

## 2024-02-15 DIAGNOSIS — Z79899 Other long term (current) drug therapy: Secondary | ICD-10-CM | POA: Diagnosis not present

## 2024-02-15 DIAGNOSIS — I2510 Atherosclerotic heart disease of native coronary artery without angina pectoris: Secondary | ICD-10-CM | POA: Diagnosis not present

## 2024-02-15 DIAGNOSIS — Z01818 Encounter for other preprocedural examination: Secondary | ICD-10-CM

## 2024-02-15 DIAGNOSIS — M25562 Pain in left knee: Secondary | ICD-10-CM | POA: Diagnosis present

## 2024-02-15 DIAGNOSIS — I10 Essential (primary) hypertension: Secondary | ICD-10-CM | POA: Insufficient documentation

## 2024-02-15 DIAGNOSIS — Z955 Presence of coronary angioplasty implant and graft: Secondary | ICD-10-CM | POA: Diagnosis not present

## 2024-02-15 LAB — TYPE AND SCREEN
ABO/RH(D): AB POS
Antibody Screen: NEGATIVE

## 2024-02-15 MED ORDER — POLYETHYLENE GLYCOL 3350 17 G PO PACK: 17.0000 g | PACK | Freq: Every day | ORAL | 0 refills | Status: AC

## 2024-02-15 MED ORDER — HYDROMORPHONE HCL 1 MG/ML IJ SOLN: 0.5000 mg | INTRAMUSCULAR | Status: DC | PRN

## 2024-02-15 MED ORDER — ACETAMINOPHEN 500 MG PO TABS
1000.0000 mg | ORAL_TABLET | Freq: Four times a day (QID) | ORAL | Status: AC
  Administered 2024-02-15 – 2024-02-16 (×4): 1000 mg via ORAL
  Filled 2024-02-15 (×4): qty 2

## 2024-02-15 MED ORDER — DOCUSATE SODIUM 100 MG PO CAPS
100.0000 mg | ORAL_CAPSULE | Freq: Two times a day (BID) | ORAL | Status: DC
  Administered 2024-02-15 – 2024-02-16 (×3): 100 mg via ORAL
  Filled 2024-02-15 (×3): qty 1

## 2024-02-15 MED ORDER — DEXAMETHASONE SOD PHOSPHATE PF 10 MG/ML IJ SOLN
INTRAMUSCULAR | Status: DC | PRN
  Administered 2024-02-15: 10 mg via INTRAVENOUS

## 2024-02-15 MED ORDER — OXYCODONE HCL 5 MG PO TABS: 5.0000 mg | ORAL_TABLET | Freq: Once | ORAL | Status: DC | PRN

## 2024-02-15 MED ORDER — METHOCARBAMOL 500 MG PO TABS: 500.0000 mg | ORAL_TABLET | Freq: Three times a day (TID) | ORAL | 0 refills | Status: AC | PRN

## 2024-02-15 MED ORDER — LACTATED RINGERS IV SOLN: INTRAVENOUS | Status: DC

## 2024-02-15 MED ORDER — VALSARTAN-HYDROCHLOROTHIAZIDE 80-12.5 MG PO TABS: 1.0000 | ORAL_TABLET | Freq: Every day | ORAL | Status: DC

## 2024-02-15 MED ORDER — METOPROLOL TARTRATE 12.5 MG HALF TABLET
12.5000 mg | ORAL_TABLET | Freq: Two times a day (BID) | ORAL | Status: DC
  Administered 2024-02-15 – 2024-02-16 (×2): 12.5 mg via ORAL
  Filled 2024-02-15 (×2): qty 1

## 2024-02-15 MED ORDER — ONDANSETRON HCL 4 MG PO TABS: 4.0000 mg | ORAL_TABLET | Freq: Four times a day (QID) | ORAL | Status: DC | PRN

## 2024-02-15 MED ORDER — PROPOFOL 500 MG/50ML IV EMUL
INTRAVENOUS | Status: DC | PRN
  Administered 2024-02-15: 40 ug/kg/min via INTRAVENOUS

## 2024-02-15 MED ORDER — ONDANSETRON HCL 4 MG/2ML IJ SOLN: 4.0000 mg | Freq: Four times a day (QID) | INTRAMUSCULAR | Status: DC | PRN

## 2024-02-15 MED ORDER — FENTANYL CITRATE (PF) 100 MCG/2ML IJ SOLN
INTRAMUSCULAR | Status: AC
  Filled 2024-02-15: qty 2

## 2024-02-15 MED ORDER — DROPERIDOL 2.5 MG/ML IJ SOLN: 0.6250 mg | Freq: Once | INTRAMUSCULAR | Status: DC | PRN

## 2024-02-15 MED ORDER — ACETAMINOPHEN 500 MG PO TABS
1000.0000 mg | ORAL_TABLET | Freq: Once | ORAL | Status: AC
  Administered 2024-02-15: 1000 mg via ORAL
  Filled 2024-02-15: qty 2

## 2024-02-15 MED ORDER — PROPOFOL 1000 MG/100ML IV EMUL
INTRAVENOUS | Status: AC
  Filled 2024-02-15: qty 100

## 2024-02-15 MED ORDER — BUPIVACAINE LIPOSOME 1.3 % IJ SUSP: 20.0000 mL | Freq: Once | INTRAMUSCULAR | Status: DC

## 2024-02-15 MED ORDER — ONDANSETRON HCL 4 MG/2ML IJ SOLN
INTRAMUSCULAR | Status: AC
  Filled 2024-02-15: qty 2

## 2024-02-15 MED ORDER — KETOROLAC TROMETHAMINE 15 MG/ML IJ SOLN
7.5000 mg | Freq: Four times a day (QID) | INTRAMUSCULAR | Status: AC
  Administered 2024-02-15 – 2024-02-16 (×4): 7.5 mg via INTRAVENOUS
  Filled 2024-02-15 (×4): qty 1

## 2024-02-15 MED ORDER — IRBESARTAN 75 MG PO TABS
75.0000 mg | ORAL_TABLET | Freq: Every day | ORAL | Status: DC
  Administered 2024-02-16: 75 mg via ORAL
  Filled 2024-02-15: qty 1

## 2024-02-15 MED ORDER — METHOCARBAMOL 1000 MG/10ML IJ SOLN: 500.0000 mg | Freq: Four times a day (QID) | INTRAMUSCULAR | Status: DC | PRN

## 2024-02-15 MED ORDER — ASPIRIN 81 MG PO CHEW
81.0000 mg | CHEWABLE_TABLET | Freq: Two times a day (BID) | ORAL | Status: DC
  Administered 2024-02-15 – 2024-02-16 (×2): 81 mg via ORAL
  Filled 2024-02-15 (×2): qty 1

## 2024-02-15 MED ORDER — FENTANYL CITRATE (PF) 50 MCG/ML IJ SOSY: 25.0000 ug | PREFILLED_SYRINGE | INTRAMUSCULAR | Status: DC | PRN

## 2024-02-15 MED ORDER — ONDANSETRON HCL 4 MG/2ML IJ SOLN
INTRAMUSCULAR | Status: DC | PRN
  Administered 2024-02-15: 4 mg via INTRAVENOUS

## 2024-02-15 MED ORDER — SODIUM CHLORIDE (PF) 0.9 % IJ SOLN
INTRAMUSCULAR | Status: DC | PRN
  Administered 2024-02-15: 80 mL

## 2024-02-15 MED ORDER — METHOCARBAMOL 500 MG PO TABS: 500.0000 mg | ORAL_TABLET | Freq: Four times a day (QID) | ORAL | Status: DC | PRN

## 2024-02-15 MED ORDER — SODIUM CHLORIDE 0.9 % IV SOLN: INTRAVENOUS | Status: DC

## 2024-02-15 MED ORDER — OXYCODONE HCL 5 MG PO TABS: 5.0000 mg | ORAL_TABLET | ORAL | 0 refills | Status: AC | PRN

## 2024-02-15 MED ORDER — CITALOPRAM HYDROBROMIDE 20 MG PO TABS
40.0000 mg | ORAL_TABLET | Freq: Every day | ORAL | Status: DC
  Administered 2024-02-16: 40 mg via ORAL
  Filled 2024-02-15: qty 2

## 2024-02-15 MED ORDER — PANTOPRAZOLE SODIUM 40 MG PO TBEC
40.0000 mg | DELAYED_RELEASE_TABLET | Freq: Every day | ORAL | Status: DC
  Administered 2024-02-15 – 2024-02-16 (×2): 40 mg via ORAL
  Filled 2024-02-15 (×2): qty 1

## 2024-02-15 MED ORDER — ZOLPIDEM TARTRATE 5 MG PO TABS: 5.0000 mg | ORAL_TABLET | Freq: Every evening | ORAL | Status: DC | PRN

## 2024-02-15 MED ORDER — POLYETHYLENE GLYCOL 3350 17 G PO PACK: 17.0000 g | PACK | Freq: Every day | ORAL | Status: DC | PRN

## 2024-02-15 MED ORDER — ASPIRIN 81 MG PO TBEC: 81.0000 mg | DELAYED_RELEASE_TABLET | Freq: Two times a day (BID) | ORAL | Status: AC

## 2024-02-15 MED ORDER — DEXAMETHASONE SOD PHOSPHATE PF 10 MG/ML IJ SOLN
INTRAMUSCULAR | Status: AC
  Filled 2024-02-15: qty 1

## 2024-02-15 MED ORDER — ACETAMINOPHEN 500 MG PO TABS: 1000.0000 mg | ORAL_TABLET | Freq: Once | ORAL | Status: DC

## 2024-02-15 MED ORDER — ACETAMINOPHEN 500 MG PO TABS: 1000.0000 mg | ORAL_TABLET | Freq: Three times a day (TID) | ORAL | Status: AC | PRN

## 2024-02-15 MED ORDER — MIDAZOLAM HCL 2 MG/2ML IJ SOLN
INTRAMUSCULAR | Status: AC
  Filled 2024-02-15: qty 2

## 2024-02-15 MED ORDER — PHENOL 1.4 % MT LIQD: 1.0000 | OROMUCOSAL | Status: DC | PRN

## 2024-02-15 MED ORDER — SODIUM CHLORIDE (PF) 0.9 % IJ SOLN
INTRAMUSCULAR | Status: AC
  Filled 2024-02-15: qty 30

## 2024-02-15 MED ORDER — NITROGLYCERIN 0.4 MG SL SUBL: 0.4000 mg | SUBLINGUAL_TABLET | SUBLINGUAL | Status: DC | PRN

## 2024-02-15 MED ORDER — BUPIVACAINE IN DEXTROSE 0.75-8.25 % IT SOLN
INTRATHECAL | Status: DC | PRN
  Administered 2024-02-15: 1.8 mL via INTRATHECAL

## 2024-02-15 MED ORDER — FENTANYL CITRATE (PF) 100 MCG/2ML IJ SOLN
INTRAMUSCULAR | Status: DC | PRN
  Administered 2024-02-15: 100 ug via INTRAVENOUS

## 2024-02-15 MED ORDER — ACETAMINOPHEN 325 MG PO TABS: 325.0000 mg | ORAL_TABLET | Freq: Four times a day (QID) | ORAL | Status: DC | PRN

## 2024-02-15 MED ORDER — ROSUVASTATIN CALCIUM 10 MG PO TABS
10.0000 mg | ORAL_TABLET | Freq: Every day | ORAL | Status: DC
  Administered 2024-02-16: 10 mg via ORAL
  Filled 2024-02-15: qty 1

## 2024-02-15 MED ORDER — MENTHOL 3 MG MT LOZG: 1.0000 | LOZENGE | OROMUCOSAL | Status: DC | PRN

## 2024-02-15 MED ORDER — DEXAMETHASONE SOD PHOSPHATE PF 10 MG/ML IJ SOLN: 8.0000 mg | Freq: Once | INTRAMUSCULAR | Status: DC

## 2024-02-15 MED ORDER — DIPHENHYDRAMINE HCL 12.5 MG/5ML PO ELIX: 12.5000 mg | ORAL_SOLUTION | ORAL | Status: DC | PRN

## 2024-02-15 MED ORDER — EZETIMIBE 10 MG PO TABS
10.0000 mg | ORAL_TABLET | Freq: Every day | ORAL | Status: DC
  Administered 2024-02-16: 10 mg via ORAL
  Filled 2024-02-15: qty 1

## 2024-02-15 MED ORDER — CEFAZOLIN SODIUM-DEXTROSE 2-4 GM/100ML-% IV SOLN
2.0000 g | Freq: Four times a day (QID) | INTRAVENOUS | Status: AC
  Administered 2024-02-15 (×2): 2 g via INTRAVENOUS
  Filled 2024-02-15 (×2): qty 100

## 2024-02-15 MED ORDER — ALPRAZOLAM 0.25 MG PO TABS: 0.2500 mg | ORAL_TABLET | Freq: Every evening | ORAL | Status: DC | PRN

## 2024-02-15 MED ORDER — PHENYLEPHRINE HCL-NACL 20-0.9 MG/250ML-% IV SOLN
INTRAVENOUS | Status: AC
  Filled 2024-02-15: qty 500

## 2024-02-15 MED ORDER — TRANEXAMIC ACID-NACL 1000-0.7 MG/100ML-% IV SOLN
1000.0000 mg | INTRAVENOUS | Status: AC
  Administered 2024-02-15: 1000 mg via INTRAVENOUS
  Filled 2024-02-15: qty 100

## 2024-02-15 MED ORDER — MIDAZOLAM HCL 5 MG/5ML IJ SOLN
INTRAMUSCULAR | Status: DC | PRN
  Administered 2024-02-15: 2 mg via INTRAVENOUS

## 2024-02-15 MED ORDER — ONDANSETRON 4 MG PO TBDP: 4.0000 mg | ORAL_TABLET | Freq: Three times a day (TID) | ORAL | 0 refills | Status: AC | PRN

## 2024-02-15 MED ORDER — BUPIVACAINE-EPINEPHRINE (PF) 0.25% -1:200000 IJ SOLN
INTRAMUSCULAR | Status: AC
  Filled 2024-02-15: qty 30

## 2024-02-15 MED ORDER — HYDROCHLOROTHIAZIDE 12.5 MG PO TABS
12.5000 mg | ORAL_TABLET | Freq: Every day | ORAL | Status: DC
  Administered 2024-02-16: 12.5 mg via ORAL
  Filled 2024-02-15: qty 1

## 2024-02-15 MED ORDER — ISOPROPYL ALCOHOL 70 % SOLN
Status: DC | PRN
  Administered 2024-02-15: 1 via TOPICAL

## 2024-02-15 MED ORDER — CELECOXIB 100 MG PO CAPS: 100.0000 mg | ORAL_CAPSULE | Freq: Two times a day (BID) | ORAL | 0 refills | Status: AC

## 2024-02-15 MED ORDER — WATER FOR IRRIGATION, STERILE IR SOLN
Status: DC | PRN
  Administered 2024-02-15: 2000 mL

## 2024-02-15 MED ORDER — SODIUM CHLORIDE 0.9 % IR SOLN
Status: DC | PRN
  Administered 2024-02-15: 3000 mL

## 2024-02-15 MED ORDER — OXYCODONE HCL 5 MG PO TABS: 5.0000 mg | ORAL_TABLET | ORAL | Status: DC | PRN

## 2024-02-15 MED ORDER — ORAL CARE MOUTH RINSE: 15.0000 mL | Freq: Once | OROMUCOSAL | Status: AC

## 2024-02-15 MED ORDER — PHENYLEPHRINE HCL-NACL 20-0.9 MG/250ML-% IV SOLN
INTRAVENOUS | Status: DC | PRN
  Administered 2024-02-15: 50 ug/min via INTRAVENOUS

## 2024-02-15 MED ORDER — BUPIVACAINE LIPOSOME 1.3 % IJ SUSP
INTRAMUSCULAR | Status: AC
  Filled 2024-02-15: qty 20

## 2024-02-15 MED ORDER — POVIDONE-IODINE 10 % EX SWAB: 2.0000 | Freq: Once | CUTANEOUS | Status: DC

## 2024-02-15 MED ORDER — BUPIVACAINE HCL (PF) 0.25 % IJ SOLN
INTRAMUSCULAR | Status: DC | PRN
  Administered 2024-02-15: 20 mL via PERINEURAL

## 2024-02-15 MED ORDER — FOLIC ACID 1 MG PO TABS
1.0000 mg | ORAL_TABLET | Freq: Every day | ORAL | Status: DC
  Administered 2024-02-16: 1 mg via ORAL
  Filled 2024-02-15: qty 1

## 2024-02-15 MED ORDER — CHLORHEXIDINE GLUCONATE 0.12 % MT SOLN
15.0000 mL | Freq: Once | OROMUCOSAL | Status: AC
  Administered 2024-02-15: 15 mL via OROMUCOSAL

## 2024-02-15 MED ORDER — 0.9 % SODIUM CHLORIDE (POUR BTL) OPTIME
TOPICAL | Status: DC | PRN
  Administered 2024-02-15: 1000 mL

## 2024-02-15 MED ORDER — OXYCODONE HCL 5 MG/5ML PO SOLN: 5.0000 mg | Freq: Once | ORAL | Status: DC | PRN

## 2024-02-15 MED ORDER — CEFAZOLIN SODIUM-DEXTROSE 2-4 GM/100ML-% IV SOLN
2.0000 g | INTRAVENOUS | Status: AC
  Administered 2024-02-15: 2 g via INTRAVENOUS
  Filled 2024-02-15: qty 100

## 2024-02-15 NOTE — Anesthesia Procedure Notes (Signed)
 Anesthesia Regional Block: Adductor canal block   Pre-Anesthetic Checklist: , timeout performed,  Correct Patient, Correct Site, Correct Laterality,  Correct Procedure, Correct Position, site marked,  Risks and benefits discussed,  Surgical consent,  Pre-op evaluation,  At surgeon's request and post-op pain management  Laterality: Lower and Left  Prep: chloraprep       Needles:  Injection technique: Single-shot  Needle Type: Echogenic Needle     Needle Length: 9cm  Needle Gauge: 21     Additional Needles:   Procedures:,,,, ultrasound used (permanent image in chart),,    Narrative:  Start time: 02/15/2024 7:03 AM End time: 02/15/2024 7:05 AM Injection made incrementally with aspirations every 5 mL.  Performed by: Personally  Anesthesiologist: Boone Fess, MD  Additional Notes: Patient's chart reviewed and they were deemed appropriate candidate for procedure, per surgeon's request. Patient educated about risks, benefits, and alternatives of the block including but not limited to: temporary or permanent nerve damage, bleeding, infection, damage to surround tissues, block failure, local anesthetic toxicity. Patient expressed understanding. A formal time-out was conducted consistent with institution rules.  Monitors were applied, and minimal sedation used (see nursing record). The site was prepped with skin prep and allowed to dry, and sterile gloves were used. A high frequency linear ultrasound probe with probe cover was utilized throughout. Femoral artery visualized at mid-thigh level, local anesthetic injected anterolateral to it, and echogenic block needle trajectory was monitored throughout. Hydrodissection of saphenous nerve visualized and appeared anatomically normal. Aspiration performed every 5ml. Blood vessels were avoided. All injections were performed without resistance and free of blood and paresthesias. The patient tolerated the procedure well.  Injectate: 20ml 0.25%  bupivacaine 

## 2024-02-15 NOTE — Progress Notes (Signed)
 Orthopedic Tech Progress Note Patient Details:  Tyrone Sherman [birth year] [id number]  Ortho Devices Type of Ortho Device: Bone foam zero knee Ortho Device/Splint Location: left Ortho Device/Splint Interventions: Ordered, Application, Adjustment   Post Interventions Patient Tolerated: Well Instructions Provided: Adjustment of device, Care of device  Tyrone Sherman 02/15/2024, 9:47 AM

## 2024-02-15 NOTE — Discharge Instructions (Signed)

## 2024-02-15 NOTE — Evaluation (Signed)
 Physical Therapy Evaluation Patient Details Name: Tyrone Sherman MRN: [id number] DOB: January 07, 1951 Today's Date: 02/15/2024  History of Present Illness  74 yo male s/p L TKA on 02/15/24. PMH: HTN, OSA, DJD, ventral hernia, CAD, CABG x6, trigger thumb R hand  Clinical Impression  Pt is s/p TKA resulting in the deficits listed below (see PT Problem List).  Pt doing very well today, pain controlled. Pt motivated to work with PT.  Pt amb 35' with RW, CGA. Anticipate steady progress in acute setting.   Pt will benefit from acute skilled PT to increase their independence and safety with mobility to allow discharge.          If plan is discharge home, recommend the following: A little help with bathing/dressing/bathroom;Help with stairs or ramp for entrance;Assist for transportation;Assistance with cooking/housework   Can travel by private vehicle        Equipment Recommendations None recommended by PT  Recommendations for Other Services       Functional Status Assessment Patient has had a recent decline in their functional status and demonstrates the ability to make significant improvements in function in a reasonable and predictable amount of time.     Precautions / Restrictions Precautions Precautions: Knee Restrictions Weight Bearing Restrictions Per Provider Order: No Other Position/Activity Restrictions: WBAT      Mobility  Bed Mobility Overal bed mobility: Needs Assistance Bed Mobility: Supine to Sit     Supine to sit: Supervision     General bed mobility comments: for safety, no physical assist    Transfers Overall transfer level: Needs assistance Equipment used: Rolling walker (2 wheels) Transfers: Sit to/from Stand Sit to Stand: Contact guard assist           General transfer comment: cues for hand placement and LLE position    Ambulation/Gait Ambulation/Gait assistance: Contact guard assist Gait Distance (Feet): 35 Feet Assistive device: Rolling walker  (2 wheels) Gait Pattern/deviations: Step-to pattern       General Gait Details: cues for sequence and RW position. no LOB, no knee instability  Stairs            Wheelchair Mobility     Tilt Bed    Modified Rankin (Stroke Patients Only)       Balance Overall balance assessment: No apparent balance deficits (not formally assessed)                                           Pertinent Vitals/Pain Pain Assessment Pain Assessment: 0-10 Pain Location: L knee Pain Descriptors / Indicators: Sore, Discomfort Pain Intervention(s): Limited activity within patient's tolerance, Monitored during session, Premedicated before session    Home Living Family/patient expects to be discharged to:: Private residence Living Arrangements: Spouse/significant other;Children Available Help at Discharge: Family Type of Home: House Home Access: Level entry     Alternate Level Stairs-Number of Steps: 8+8 Home Layout: Multi-level;Bed/bath upstairs Home Equipment: Agricultural Consultant (2 wheels);Cane - single point;Shower seat Additional Comments: can stay downstairs initially-dtr bringing bed for main level    Prior Function Prior Level of Function : Independent/Modified Independent;Driving                     Extremity/Trunk Assessment   Upper Extremity Assessment Upper Extremity Assessment: Overall WFL for tasks assessed    Lower Extremity Assessment Lower Extremity Assessment: RLE deficits/detail RLE Deficits / Details: [facility]  LLE Deficits / Details: ankle WFL, knee extension and hip flexion 3/5, anticipated post op deficits       Communication   Communication Communication: No apparent difficulties    Cognition Arousal: Alert Behavior During Therapy: WFL for tasks assessed/performed   PT - Cognitive impairments: No apparent impairments                         Following commands: Intact       Cueing Cueing Techniques: Verbal cues      General Comments      Exercises Total Joint Exercises Ankle Circles/Pumps: AROM, Both, 10 reps   Assessment/Plan    PT Assessment Patient needs continued PT services  PT Problem List Decreased strength;Decreased range of motion;Decreased activity tolerance;Decreased balance;Decreased knowledge of use of DME;Decreased mobility       PT Treatment Interventions DME instruction;Therapeutic exercise;Gait training;Functional mobility training;Therapeutic activities;Patient/family education;Stair training    PT Goals (Current goals can be found in the Care Plan section)  Acute Rehab PT Goals PT Goal Formulation: With patient Time For Goal Achievement: 02/22/24 Potential to Achieve Goals: Good    Frequency 7X/week     Co-evaluation               AM-PAC PT 6 Clicks Mobility  Outcome Measure Help needed turning from your back to your side while in a flat bed without using bedrails?: A Little Help needed moving from lying on your back to sitting on the side of a flat bed without using bedrails?: A Little Help needed moving to and from a bed to a chair (including a wheelchair)?: A Little Help needed standing up from a chair using your arms (e.g., wheelchair or bedside chair)?: A Little Help needed to walk in hospital room?: A Little Help needed climbing 3-5 steps with a railing? : A Little 6 Click Score: 18    End of Session Equipment Utilized During Treatment: Gait belt Activity Tolerance: Patient tolerated treatment well Patient left: with call bell/phone within reach;in chair;with chair alarm set Nurse Communication: Mobility status PT Visit Diagnosis: Other abnormalities of gait and mobility (R26.89)    Time: 8487-8469 PT Time Calculation (min) (ACUTE ONLY): 18 min   Charges:   PT Evaluation $PT Eval Low Complexity: 1 Low   PT General Charges $$ ACUTE PT VISIT: 1 Visit         Shalika Arntz, PT  Acute Rehab Dept [facility])  [phone]  02/15/2024   [name] 02/15/2024, 4:17 PM

## 2024-02-15 NOTE — Plan of Care (Signed)
" °  Problem: Education: Goal: Knowledge of the prescribed therapeutic regimen will improve Outcome: Progressing   Problem: Bowel/Gastric: Goal: Gastrointestinal status for postoperative course will improve Outcome: Progressing   Problem: Cardiac: Goal: Ability to maintain an adequate cardiac output Outcome: Progressing Goal: Will show no evidence of cardiac arrhythmias Outcome: Progressing   Problem: Nutritional: Goal: Will attain and maintain optimal nutritional status Outcome: Progressing   Problem: Neurological: Goal: Will regain or maintain usual level of consciousness Outcome: Progressing   Problem: Clinical Measurements: Goal: Ability to maintain clinical measurements within normal limits Outcome: Progressing Goal: Postoperative complications will be avoided or minimized Outcome: Progressing   Problem: Respiratory: Goal: Will regain and/or maintain adequate ventilation Outcome: Progressing Goal: Respiratory status will improve Outcome: Progressing   Problem: Skin Integrity: Goal: Demonstrates signs of wound healing without infection Outcome: Progressing   Problem: Urinary Elimination: Goal: Will remain free from infection Outcome: Progressing Goal: Ability to achieve and maintain adequate urine output Outcome: Progressing   Problem: Education: Goal: Knowledge of General Education information will improve Description: Including pain rating scale, medication(s)/side effects and non-pharmacologic comfort measures Outcome: Progressing   Problem: Health Behavior/Discharge Planning: Goal: Ability to manage health-related needs will improve Outcome: Progressing   Problem: Clinical Measurements: Goal: Ability to maintain clinical measurements within normal limits will improve Outcome: Progressing Goal: Will remain free from infection Outcome: Progressing Goal: Diagnostic test results will improve Outcome: Progressing Goal: Respiratory complications will  improve Outcome: Progressing Goal: Cardiovascular complication will be avoided Outcome: Progressing   Problem: Activity: Goal: Risk for activity intolerance will decrease Outcome: Progressing   Problem: Nutrition: Goal: Adequate nutrition will be maintained Outcome: Progressing   Problem: Coping: Goal: Level of anxiety will decrease Outcome: Progressing   Problem: Elimination: Goal: Will not experience complications related to bowel motility Outcome: Progressing Goal: Will not experience complications related to urinary retention Outcome: Progressing   Problem: Pain Managment: Goal: General experience of comfort will improve and/or be controlled Outcome: Progressing   Problem: Safety: Goal: Ability to remain free from injury will improve Outcome: Progressing   Problem: Skin Integrity: Goal: Risk for impaired skin integrity will decrease Outcome: Progressing   Problem: Education: Goal: Knowledge of the prescribed therapeutic regimen will improve Outcome: Progressing Goal: Individualized Educational Video(s) Outcome: Progressing   Problem: Activity: Goal: Ability to avoid complications of mobility impairment will improve Outcome: Progressing Goal: Range of joint motion will improve Outcome: Progressing   Problem: Clinical Measurements: Goal: Postoperative complications will be avoided or minimized Outcome: Progressing   Problem: Pain Management: Goal: Pain level will decrease with appropriate interventions Outcome: Progressing   Problem: Skin Integrity: Goal: Will show signs of wound healing Outcome: Progressing   "

## 2024-02-15 NOTE — Anesthesia Procedure Notes (Signed)
 Spinal  Patient location during procedure: OR Start time: 02/15/2024 7:28 AM End time: 02/15/2024 7:31 AM Reason for block: surgical anesthesia  Staffing Performed: resident/CRNA  Authorized by: Boone Fess, MD   Performed by: Hortensia Duffin C, CRNA  Preanesthetic Checklist Completed: patient identified, IV checked, site marked, risks and benefits discussed, surgical consent, monitors and equipment checked, pre-op evaluation and timeout performed Spinal Block Patient position: sitting Prep: ChloraPrep and site prepped and draped Patient monitoring: heart rate, cardiac monitor, continuous pulse ox and blood pressure Approach: midline Location: L3-4 Injection technique: single-shot Needle Needle type: Pencan  Needle gauge: 24 G Needle length: 9 cm Assessment Sensory level: T4 Events: CSF return  Additional Notes IV functioning, monitors applied to pt. Expiration date of kit checked and confirmed to be in date. Sterile prep and drape, hand hygiene and sterile gloved used. Pt was positioned and spine was prepped in sterile fashion. Skin was anesthetized with lidocaine . Free flow of clear CSF obtained prior to injecting local anesthetic into CSF x 1 attempt. Spinal needle aspirated freely following injection. Needle was carefully withdrawn, and pt tolerated procedure well. Loss of motor and sensory on exam post injection.

## 2024-02-15 NOTE — Anesthesia Postprocedure Evaluation (Signed)
"   Anesthesia Post Note  Patient: Tyrone Sherman  Procedure(s) Performed: ARTHROPLASTY, KNEE, TOTAL (Left: Knee)     Patient location during evaluation: PACU Anesthesia Type: Spinal Level of consciousness: oriented and awake and alert Pain management: pain level controlled Vital Signs Assessment: post-procedure vital signs reviewed and stable Respiratory status: spontaneous breathing, respiratory function stable and patient connected to nasal cannula oxygen Cardiovascular status: blood pressure returned to baseline and stable Postop Assessment: no headache, no backache and no apparent nausea or vomiting Anesthetic complications: no   No notable events documented.  Last Vitals:  Vitals:   02/15/24 1000 02/15/24 1016  BP: 131/75 124/82  Pulse: 67 67  Resp: 14 16  Temp: 36.6 C 36.8 C  SpO2: 97% 96%    Last Pain:  Vitals:   02/15/24 1016  TempSrc: Oral  PainSc: 0-No pain                 [name]      "

## 2024-02-15 NOTE — Interval H&P Note (Signed)

## 2024-02-15 NOTE — Transfer of Care (Signed)
 Immediate Anesthesia Transfer of Care Note  Patient: Tyrone Sherman  Procedure(s) Performed: ARTHROPLASTY, KNEE, TOTAL (Left: Knee)  Patient Location: PACU  Anesthesia Type:Epidural  Level of Consciousness: awake, alert , and oriented  Airway & Oxygen Therapy: Patient Spontanous Breathing and Patient connected to nasal cannula oxygen  Post-op Assessment: Report given to RN and Post -op Vital signs reviewed and stable  Post vital signs: Reviewed and stable  Last Vitals:  Vitals Value Taken Time  BP 118/75 02/15/24 09:22  Temp    Pulse 68 02/15/24 09:28  Resp 20 02/15/24 09:28  SpO2 93 % 02/15/24 09:28  Vitals shown include unfiled device data.  Last Pain:  Vitals:   02/15/24 0621  TempSrc: Oral  PainSc: 0-No pain         Complications: No notable events documented.

## 2024-02-15 NOTE — Telephone Encounter (Signed)
 Pharmacy Patient Advocate Encounter  Received notification from [employer] that Prior Authorization for Repatha  has been APPROVED from 01/19/24 to 02/14/25. Ran test claim, Copay is $47.00- one month. This test claim was processed through [facility]- copay amounts may vary at other pharmacies due to pharmacy/plan contracts, or as the patient moves through the different stages of their insurance plan.   PA #/Case ID/Reference #: [id number]

## 2024-02-15 NOTE — Telephone Encounter (Signed)
 Pharmacy Patient Advocate Encounter   Received notification from Physician's Office that prior authorization for repatha  is required/requested.   Insurance verification completed.   The patient is insured through [employer].   Per test claim: PA required; PA submitted to above mentioned insurance via Latent Key/confirmation #/EOC [id number] Status is pending

## 2024-02-15 NOTE — Op Note (Signed)
 DATE OF SURGERY:  02/15/2024 TIME: 8:56 AM  PATIENT NAME:  Tyrone Sherman   AGE: 74 y.o.    PRE-OPERATIVE DIAGNOSIS: End-stage left knee osteoarthritis  POST-OPERATIVE DIAGNOSIS:  Same  PROCEDURE: Cemented left total Knee Arthroplasty  SURGEON:  Ceclia Koker A Danamarie Minami, MD   ASSISTANT: Bernarda Mclean, PA-C, present and scrubbed throughout the case, critical for assistance with exposure, retraction, instrumentation, and closure.   OPERATIVE IMPLANTS:  Cemented Zimmer persona size 8 standard CR femur, left size F tibial baseplate, 11 mm MC poly insert, 35 mm all poly cemented patella Implant Name Type Inv. Item Serial No. Manufacturer Lot No. LRB No. Used Action  CEMENT BONE R 1X40 - [id number] Cement CEMENT BONE R 1X40  [employer]IMMER [employer]ON(ORTH,TRAU,BIO,SG) [id number] Left 2 Implanted  STEM TIBIA 5 DEG SZ F L KNEE - [id number] Knees STEM TIBIA 5 DEG SZ F L KNEE  ZIMMER [employer]ON(ORTH,TRAU,BIO,SG) [id number] Left 1 Implanted  FEMORAL KNEE COMP SZ 8STD LT - [id number] Knees FEMORAL KNEE COMP SZ 8STD LT  ZIMMER [employer]ON(ORTH,TRAU,BIO,SG) [id number] Left 1 Implanted  STEM POLY PAT PLY 63M KNEE - [id number] Knees STEM POLY PAT PLY 63M KNEE  ZIMMER [employer]ON(ORTH,TRAU,BIO,SG) [id number] Left 1 Implanted  INSERT MED AS PERS SZ 8-11 LT - [id number] Insert INSERT MED AS PERS SZ 8-11 LT  Z[employer]MER [employer]ON(ORTH,TRAU,BIO,SG) [id number] Left 1 Implanted      PREOPERATIVE INDICATIONS:  Tyrone Sherman is a 74 y.o. year old male with end stage bone on bone degenerative arthritis of the knee who failed conservative treatment, including injections, antiinflammatories, activity modification, and assistive devices, and had significant impairment of their activities of daily living, and elected for Total Knee Arthroplasty.   The risks, benefits, and alternatives were discussed at length including but not limited to the risks of infection, bleeding, nerve injury, stiffness, blood clots, the need for revision surgery, cardiopulmonary  complications, among others, and they were willing to proceed.   ESTIMATED BLOOD LOSS: 50cc  OPERATIVE DESCRIPTION:   Once adequate anesthesia was induced, preoperative antibiotics, 2 gm of ancef ,1 gm of Tranexamic Acid , and 8 mg of Decadron  administered, the patient was positioned supine with a left thigh tourniquet placed.  The left lower extremity was prepped and draped in sterile fashion.  A time-  out was performed identifying the patient, planned procedure, and the appropriate extremity.     The leg was  exsanguinated, tourniquet elevated to 250 mmHg.  A midline incision was  made followed by median parapatellar arthrotomy. Anterior horn of the medial meniscus was released and resected. A medial release was performed, the infrapatellar fat pad was resected with care taken to protect the patellar tendon. The suprapatellar fat was removed to exposed the distal anterior femur. The anterior horn of the lateral meniscus and ACL were released.    Following initial  exposure, I first started with the femur  The femoral  canal was opened with a drill, canal was suctioned to try to prevent fat emboli.  An  intramedullary rod was passed set at 5 degrees valgus, 10 mm. The distal femur was resected.  Following this resection, the tibia was  subluxated anteriorly.  Using the extramedullary guide, 10 mm of bone was resected off   the proximal lateral tibia.  The knee was still relatively tight and lacked full extension elected to resect additional 2 mm off the proximal tibia which had improved extension.  We confirmed the gap would be  stable medially and laterally with a size 10mm spacer block as well  as confirmed that the tibial cut was perpendicular in the coronal plane, checking with an alignment rod.    Once this was done, the posterior femoral referencing femoral sizer was placed under to the posterior condyles with 3 degrees of external rotational which was parallel to the transepicondylar axis and  perpendicular to [employer]. The femur was sized to be a size 8 in the anterior-  posterior dimension. The  anterior, posterior, and  chamfer cuts were made without difficulty nor   notching making certain that I was along the anterior cortex to help  with flexion gap stability. Next a laminar spreader was placed with the knee in flexion and the medial lateral menisci were resected.  5 cc of the Exparel  mixture was injected in the medial side of the back of the knee and 3 cc in the lateral side.  1/2 inch curved osteotome was used to resect posterior osteophyte that was then removed with a pituitary rongeur.       At this point, the tibia was sized to be a size F.  The size F tray was  then pinned in position. Trial reduction was now carried with a 8 femur, F tibia, a 10 mm MC insert.  The knee had full extension and was stable to varus valgus stress in extension.  The knee was slightly tight in flexion and the PCL was partially released.   Attention was next directed to the patella.  Precut  measurement was noted to be 24 mm.  I resected down to 14 mm and used a  35mm patellar button to restore patellar height as well as cover the cut surface.     The patella lug holes were drilled and a 35mm patella poly trial was placed.    The knee was brought to full extension with good flexion stability with the patella tracking through the trochlea without application of pressure.     Next the femoral component was again assessed and determined to be seated and appropriately lateralized.  The femoral lug holes were drilled.  The femoral component was then removed. Tibial component was again assessed and felt to be seated and appropriately rotated with the medial third of the tubercle. The tibia was then drilled, and keel punched.     Final components were  opened and cement was mixed.      Final implants were then  cemented onto cleaned and dried cut surfaces of bone with the knee brought to extension  with a 19 mm MC poly.  The knee was irrigated with sterile Betadine  diluted in saline as well as pulse lavage normal saline.  The synovial lining was  then injected a dilute Exparel  with 30cc of 0.25% marcaine  with epinephrine .         Once the cement had fully cured, excess cement was removed throughout the knee.  I confirmed that I was satisfied with the range of motion and stability, and the final 11mm MC poly insert was chosen.  It was placed into the knee.         The tourniquet had been let down.  No significant hemostasis was required.  The medial parapatellar arthrotomy was then reapproximated using #1 Stratafix sutures with the knee  in flexion.  The remaining wound was closed with 0 stratafix, 2-0 Vicryl, and running 3-0 Monocryl. The knee was cleaned, dried, dressed sterilely using Dermabond and   Aquacel dressing.  The patient was then brought to recovery room in stable condition, tolerating  the procedure  well. There were no complications.   Post op recs: WB: WBAT Abx: ancef  Imaging: PACU xrays DVT prophylaxis: Aspirin  81mg  BID x4 weeks Follow up: 2 weeks after surgery for a wound check with Dr. Edna at [facility].  Address: [address], [place], [place] [address]  Office Phone: [phone]  Toribio Edna, MD Orthopaedic Surgery

## 2024-02-16 ENCOUNTER — Encounter (HOSPITAL_COMMUNITY): Payer: Self-pay | Admitting: Orthopedic Surgery

## 2024-02-16 DIAGNOSIS — M1712 Unilateral primary osteoarthritis, left knee: Secondary | ICD-10-CM | POA: Diagnosis not present

## 2024-02-16 LAB — BASIC METABOLIC PANEL WITH GFR
Anion gap: 10 (ref 5–15)
BUN: 14 mg/dL (ref 8–23)
CO2: 24 mmol/L (ref 22–32)
Calcium: 8.8 mg/dL — ABNORMAL LOW (ref 8.9–10.3)
Chloride: 104 mmol/L (ref 98–111)
Creatinine, Ser: 0.74 mg/dL (ref 0.61–1.24)
GFR, Estimated: >60 mL/min
Glucose, Bld: 140 mg/dL — ABNORMAL HIGH (ref 70–99)
Potassium: 4.4 mmol/L (ref 3.5–5.1)
Sodium: 138 mmol/L (ref 135–145)

## 2024-02-16 LAB — CBC
HCT: 38.1 % — ABNORMAL LOW (ref 39.0–52.0)
Hemoglobin: 12.3 g/dL — ABNORMAL LOW (ref 13.0–17.0)
MCH: 28 pg (ref 26.0–34.0)
MCHC: 32.3 g/dL (ref 30.0–36.0)
MCV: 86.8 fL (ref 80.0–100.0)
Platelets: 225 10*3/uL (ref 150–400)
RBC: 4.39 MIL/uL (ref 4.22–5.81)
RDW: 14 % (ref 11.5–15.5)
WBC: 15.7 10*3/uL — ABNORMAL HIGH (ref 4.0–10.5)
nRBC: 0 % (ref 0.0–0.2)

## 2024-02-16 NOTE — Care Management Obs Status (Signed)
 MEDICARE OBSERVATION STATUS NOTIFICATION   Patient Details  Name: SLAYDEN MENNENGA MRN: [id number] Date of Birth: 1950/05/25   Medicare Observation Status Notification Given:  Yes    Alfonse JONELLE Rex, RN 02/16/2024, 9:57 AM

## 2024-02-16 NOTE — Progress Notes (Signed)
 Physical Therapy Treatment Patient Details Name: Tyrone Sherman MRN: [id number] DOB: 10-Apr-1950 Today's Date: 02/16/2024   History of Present Illness 74 yo male s/p L TKA on 02/15/24. PMH: HTN, OSA, DJD, ventral hernia, CAD, CABG x6, trigger thumb R hand    PT Comments  The patient is progressing well, minimal  pain reported L  knee. Patienthas met PT goals for DC.     If plan is discharge home, recommend the following: A little help with bathing/dressing/bathroom;Help with stairs or ramp for entrance;Assist for transportation;Assistance with cooking/housework   Can travel by private vehicle        Equipment Recommendations  None recommended by PT    Recommendations for Other Services       Precautions / Restrictions Precautions Precautions: Knee Restrictions LLE Weight Bearing Per Provider Order: Weight bearing as tolerated     Mobility  Bed Mobility   Bed Mobility: Supine to Sit     Supine to sit: Supervision          Transfers Overall transfer level: Needs assistance Equipment used: Rolling walker (2 wheels) Transfers: Sit to/from Stand Sit to Stand: Supervision           General transfer comment: cues for hand placement and LLE position    Ambulation/Gait Ambulation/Gait assistance: Supervision Gait Distance (Feet): 120 Feet Assistive device: Rolling walker (2 wheels) Gait Pattern/deviations: Step-through pattern       General Gait Details: cues for sequence and RW position. no LOB, no knee instability   Stairs Stairs: Yes Stairs assistance: Contact guard assist Stair Management: One rail Left, Step to pattern, Forwards, With cane, With crutches Number of Stairs: 3 General stair comments: performed with cane and crutch, pt reports crutch feels steadier.   Wheelchair Mobility     Tilt Bed    Modified Rankin (Stroke Patients Only)       Balance Overall balance assessment: Mild deficits observed, not formally tested                                           Communication    Cognition Arousal: Alert Behavior During Therapy: WFL for tasks assessed/performed                                    Cueing    Exercises Total Joint Exercises Quad Sets: AROM, Both, 10 reps Heel Slides: AAROM, Left, 10 reps Straight Leg Raises: AROM, Left, 10 reps Long Arc Quad: AROM, Left, 10 reps Knee Flexion: AROM, Left, 10 reps Goniometric ROM: 10-70 left knee flex    General Comments        Pertinent Vitals/Pain Pain Assessment Pain Score: 2  Pain Location: L knee Pain Descriptors / Indicators: Sore, Discomfort Pain Intervention(s): Monitored during session, Ice applied    Home Living                          Prior Function            PT Goals (current goals can now be found in the care plan section) Progress towards PT goals: Progressing toward goals    Frequency    7X/week      PT Plan      Co-evaluation  AM-PAC PT 6 Clicks Mobility   Outcome Measure  Help needed turning from your back to your side while in a flat bed without using bedrails?: None Help needed moving from lying on your back to sitting on the side of a flat bed without using bedrails?: None Help needed moving to and from a bed to a chair (including a wheelchair)?: A Little Help needed standing up from a chair using your arms (e.g., wheelchair or bedside chair)?: A Little Help needed to walk in hospital room?: A Little Help needed climbing 3-5 steps with a railing? : A Little 6 Click Score: 20    End of Session Equipment Utilized During Treatment: Gait belt Activity Tolerance: Patient tolerated treatment well Patient left: with call bell/phone within reach;in chair;with chair alarm set Nurse Communication: Mobility status PT Visit Diagnosis: Other abnormalities of gait and mobility (R26.89)     Time: 9159-9079 PT Time Calculation (min) (ACUTE ONLY): 40 min  Charges:     $Gait Training: 8-22 mins $Therapeutic Exercise: 8-22 mins $Self Care/Home Management: 8-22 PT General Charges $$ ACUTE PT VISIT: 1 Visit                     Darice Potters PT Acute Rehabilitation Services Office [phone]    Potters Darice Norris 02/16/2024, 1:21 PM

## 2024-02-16 NOTE — TOC Transition Note (Addendum)
 Transition of Care [facility]) - Discharge Note   Patient Details  Name: Tyrone Sherman MRN: [id number] Date of Birth: 1950-02-17  Transition [facility] Care [facility] CM/SW Contact:  Alfonse JONELLE Rex, RN Phone Number: 02/16/2024, 12:31 PM   Clinical Narrative:   Met with patient at bedside to review dc therapy and home equipment needs, pt confirmed OPPT, has RW, MOON completed.  No CM needs   -1:40pm pt reports his spouse sent to hospital for possible stroke, dtr with his wife, son will pick him up. Patient concerned about being able to get to OPPT. HH PT order entered by attending. [facility], rep-Lynette, accepted for [facility] PT, added to AVS.     Final next level of care: OP Rehab Barriers to Discharge: No Barriers Identified   Patient Goals and CMS Choice Patient states their goals for this hospitalization and ongoing recovery are:: return home          Discharge Placement                       Discharge Plan and Services Additional resources added to the After Visit Summary for                                       Social Drivers of Health (SDOH) Interventions SDOH Screenings   Food Insecurity: No Food Insecurity (02/15/2024)  Housing: Low Risk (02/15/2024)  Transportation Needs: No Transportation Needs (02/15/2024)  Utilities: Not At Risk (02/15/2024)  Social Connections: Socially Integrated (02/15/2024)  Tobacco Use: Low Risk (02/15/2024)     Readmission Risk Interventions     No data to display

## 2024-02-16 NOTE — Progress Notes (Signed)
" °   02/16/24 0000  BiPAP/CPAP/SIPAP  $ Non-Invasive Home Ventilator  Initial  BiPAP/CPAP/SIPAP Pt Type Adult  BiPAP/CPAP/SIPAP DREAMSTATIOND  Mask Type  (home mask)  Respiratory Rate 18 breaths/min  Patient Home Machine No  Patient Home Mask Yes  Patient Home Tubing Yes  Auto Titrate Yes  Minimum cmH2O 5 cmH2O  Maximum cmH2O 20 cmH2O  Device Plugged into RED Power Outlet Yes    "

## 2024-02-16 NOTE — Progress Notes (Signed)
" ° ° ° °  Subjective:  Patient reports pain as mild.  Did very well with physical therapy yesterday.  Denies distal numbness and tingling.  Discussed IntraOp findings.  Patient is hopeful to discharge home today.  Yesterday's total administered Morphine  Milligram Equivalents: 30   Objective:   VITALS:   Vitals:   02/15/24 1755 02/15/24 2151 02/16/24 0141 02/16/24 0527  BP: 139/75 132/71 (!) 148/78 (!) 147/83  Pulse: 84 80 74 68  Resp: 17 15 15 15   Temp: 98.4 F (36.9 C) 98.1 F (36.7 C) 98.1 F (36.7 C) 97.9 F (36.6 C)  TempSrc: Oral Oral Oral Oral  SpO2: 94% 95% 96% 95%  Weight:      Height:        Sensation intact distally Intact pulses distally Dorsiflexion/Plantar flexion intact Incision: dressing C/D/I Compartment soft    Lab Results  Component Value Date   WBC 15.7 (H) 02/16/2024   HGB 12.3 (L) 02/16/2024   HCT 38.1 (L) 02/16/2024   MCV 86.8 02/16/2024   PLT 225 02/16/2024   BMET    Component Value Date/Time   NA 138 02/16/2024 0341   NA 146 (H) 09/11/2018 0852   K 4.4 02/16/2024 0341   CL 104 02/16/2024 0341   CO2 24 02/16/2024 0341   GLUCOSE 140 (H) 02/16/2024 0341   BUN 14 02/16/2024 0341   BUN 14 09/11/2018 0852   CREATININE 0.74 02/16/2024 0341   CALCIUM  8.8 (L) 02/16/2024 0341   GFRNONAA >60 02/16/2024 0341    Xray: Total knee arthroplasty components in good position no adverse features  Assessment/Plan: 1 Day Post-Op   Principal Problem:   Primary osteoarthritis of left knee  S/p L TKA 02/15/24  Post op recs: WB: WBAT Abx: ancef  Imaging: PACU xrays DVT prophylaxis: Aspirin  81mg  BID x4 weeks Follow up: 2 weeks after surgery for a wound check with Dr. Edna at [facility].  Address: [address], [place], [place] [address]  Office Phone: [phone]    TORIBIO DELENA EDNA 02/16/2024, 7:24 AM   Toribio Edna, MD  Contact information:   [phone] 7am-5pm epic message Dr. Edna, or call  office for patient follow up: [phone] After hours and holidays please check [email or web address] for group call information for [facility]   "

## 2024-02-16 NOTE — Discharge Summary (Signed)
 Physician Discharge Summary  Patient ID: Tyrone Sherman MRN: [id number] DOB/AGE: 72-Dec-1952 72 y.o.  Admit date: 02/15/2024 Discharge date: 02/16/2024  Admission Diagnoses:  Primary osteoarthritis of left knee  Discharge Diagnoses:  Principal Problem:   Primary osteoarthritis of left knee   Past Medical History:  Diagnosis Date   Allergic rhinitis    Benign essential hypertension    Chest tightness    EXERTIONAL   Coronary artery disease    DJD (degenerative joint disease)    GERD (gastroesophageal reflux disease)    Hyperlipidemia    Hypertension    Metatarsal fracture 2012   LEFT FOOT   Obstructive sleep apnea on CPAP    Onychomycosis    Trigger thumb of right hand    Ventral hernia     Surgeries: Procedures: ARTHROPLASTY, KNEE, TOTAL on 02/15/2024   Consultants (if any):   Discharged Condition: Improved  Hospital Course: Tyrone Sherman is an 74 y.o. male who was admitted 02/15/2024 with a diagnosis of Primary osteoarthritis of left knee and went to the operating room on 02/15/2024 and underwent the above named procedures.    He was given perioperative antibiotics:  Anti-infectives (From admission, onward)    Start     Dose/Rate Route Frequency Ordered Stop   02/15/24 1330  ceFAZolin  (ANCEF ) IVPB 2g/100 mL premix        2 g 200 mL/hr over 30 Minutes Intravenous Every 6 hours 02/15/24 1015 02/15/24 2018   02/15/24 0600  ceFAZolin  (ANCEF ) IVPB 2g/100 mL premix        2 g 200 mL/hr over 30 Minutes Intravenous On call to O.R. 02/15/24 9448 02/15/24 9266     .  He was given sequential compression devices, early ambulation, and aspirin  for DVT prophylaxis.  He benefited maximally from the hospital stay and there were no complications.    Recent vital signs:  Vitals:   02/16/24 0141 02/16/24 0527  BP: (!) 148/78 (!) 147/83  Pulse: 74 68  Resp: 15 15  Temp: 98.1 F (36.7 C) 97.9 F (36.6 C)  SpO2: 96% 95%    Recent laboratory studies:  Lab Results   Component Value Date   HGB 12.3 (L) 02/16/2024   HGB 14.7 02/09/2024   HGB 10.5 (L) 05/27/2017   Lab Results  Component Value Date   WBC 15.7 (H) 02/16/2024   PLT 225 02/16/2024   Lab Results  Component Value Date   INR 1.31 05/24/2017   Lab Results  Component Value Date   NA 138 02/16/2024   K 4.4 02/16/2024   CL 104 02/16/2024   CO2 24 02/16/2024   BUN 14 02/16/2024   CREATININE 0.74 02/16/2024   GLUCOSE 140 (H) 02/16/2024    Discharge Medications:   Allergies as of 02/16/2024       Reactions   Lipitor [atorvastatin Calcium ]    UNSPECIFIED REACTION    Norvasc [amlodipine Besylate]    UNSPECIFIED REACTION    Prinivil [lisinopril]    UNSPECIFIED REACTION         Medication List     TAKE these medications    acetaminophen  500 MG tablet Commonly known as: TYLENOL  Take 2 tablets (1,000 mg total) by mouth every 8 (eight) hours as needed. What changed:  medication strength how much to take when to take this reasons to take this   ALPRAZolam  0.25 MG tablet Commonly known as: XANAX  Take 0.25 mg by mouth at bedtime as needed for anxiety.   ascorbic acid 500 MG  tablet Commonly known as: VITAMIN C Take 500 mg by mouth daily.   aspirin  EC 81 MG tablet Take 1 tablet (81 mg total) by mouth 2 (two) times daily for 28 days. Swallow whole. What changed:  when to take this additional instructions   celecoxib  100 MG capsule Commonly known as: CeleBREX  Take 1 capsule (100 mg total) by mouth 2 (two) times daily for 14 days.   citalopram  40 MG tablet Commonly known as: CELEXA  Take 40 mg by mouth daily.   ezetimibe  10 MG tablet Commonly known as: ZETIA  Take 1 tablet (10 mg total) by mouth daily.   folic acid  1 MG tablet Commonly known as: FOLVITE  Take 1 mg by mouth daily.   Garlic 500 MG Caps Take 500 mg by mouth daily.   methocarbamol  500 MG tablet Commonly known as: ROBAXIN  Take 1 tablet (500 mg total) by mouth every 8 (eight) hours as needed for  up to 10 days for muscle spasms.   metoprolol  tartrate 25 MG tablet Commonly known as: LOPRESSOR  Take 1/2 (one-half) tablet by mouth twice daily.   nitroGLYCERIN  0.4 MG SL tablet Commonly known as: NITROSTAT  1 tablet sublingually Every 5 minutes as needed for chest pain not relieved with rest 30 days   Omega 3 1200 MG Caps Take 1,200 mg by mouth daily.   omeprazole 20 MG capsule Commonly known as: PRILOSEC Take 20 mg by mouth 2 (two) times daily before a meal.   ondansetron  4 MG disintegrating tablet Commonly known as: ZOFRAN -ODT Take 1 tablet (4 mg total) by mouth every 8 (eight) hours as needed for nausea or vomiting.   oxyCODONE  5 MG immediate release tablet Commonly known as: Roxicodone  Take 1 tablet (5 mg total) by mouth every 4 (four) hours as needed for up to 7 days for severe pain (pain score 7-10) or moderate pain (pain score 4-6).   polyethylene glycol 17 g packet Commonly known as: MiraLax  Take 17 g by mouth daily.   Praluent  75 MG/ML Soaj Generic drug: Alirocumab  INJECT 1  SUBCUTANEOUSLY EVERY TWO WEEKS   rosuvastatin  10 MG tablet Commonly known as: CRESTOR  Take 10 mg by mouth daily.   valsartan -hydrochlorothiazide  80-12.5 MG tablet Commonly known as: DIOVAN -HCT Take 1 tablet by mouth daily.   Vitamin D3 50 MCG (2000 UT) Tabs Take 2,000 Units by mouth daily.   vitamin E 180 MG (400 UNITS) capsule Take 400 Units by mouth daily.        Diagnostic Studies: DG Knee Left Port Result Date: 02/15/2024 EXAM: 1 or 2 VIEW(S) XRAY OF THE LEFT KNEE 02/15/2024 09:48:00 AM COMPARISON: None available. CLINICAL HISTORY: Post-operative state. FINDINGS: BONES AND JOINTS: Left knee arthroplasty in place. Expected postoperative changes. No acute fracture. No malalignment. No significant joint effusion. SOFT TISSUES: Vascular calcifications. IMPRESSION: 1. Left knee arthroplasty in place with expected postoperative changes. Electronically signed by: Oneil Devonshire MD  02/15/2024 06:40 PM EST RP Workstation: [id number]    Disposition: Discharge disposition: 01-Home or Self Care       Discharge Instructions     Call MD / Call 911   Complete by: As directed    If you experience chest pain or shortness of breath, CALL 911 and be transported to the hospital emergency room.  If you develope a fever above 101 F, pus (white drainage) or increased drainage or redness at the wound, or calf pain, call your surgeon's office.   Constipation Prevention   Complete by: As directed    Drink plenty of  fluids.  Prune juice may be helpful.  You may use a stool softener, such as Colace (over the counter) 100 mg twice a day.  Use MiraLax  (over the counter) for constipation as needed.   Increase activity slowly as tolerated   Complete by: As directed    Post-operative opioid taper instructions:   Complete by: As directed    POST-OPERATIVE OPIOID TAPER INSTRUCTIONS: It is important to wean off of your opioid medication as soon as possible. If you do not need pain medication after your surgery it is ok to stop day one. Opioids include: Codeine, Hydrocodone(Norco, Vicodin), Oxycodone (Percocet, oxycontin ) and hydromorphone  amongst others.  Long term and even short term use of opiods can cause: Increased pain response Dependence Constipation Depression Respiratory depression And more.  Withdrawal symptoms can include Flu like symptoms Nausea, vomiting And more Techniques to manage these symptoms Hydrate well Eat regular healthy meals Stay active Use relaxation techniques(deep breathing, meditating, yoga) Do Not substitute Alcohol  to help with tapering If you have been on opioids for less than two weeks and do not have pain than it is ok to stop all together.  Plan to wean off of opioids This plan should start within one week post op of your joint replacement. Maintain the same interval or time between taking each dose and first decrease the dose.  Cut the total  daily intake of opioids by one tablet each day Next start to increase the time between doses. The last dose that should be eliminated is the evening dose.           Follow-up Information     Edna Toribio LABOR, MD Follow up in 2 week(s).   Specialty: Orthopedic Surgery Contact information: [address] [place] [address] [phone]                    Discharge Instructions      INSTRUCTIONS AFTER JOINT REPLACEMENT   Remove items at home which could result in a fall. This includes throw rugs or furniture in walking pathways ICE to the affected joint every three hours while awake for 30 minutes at a time, for at least the first 3-5 days, and then as needed for pain and swelling.  Continue to use ice for pain and swelling. You may notice swelling that will progress down to the foot and ankle.  This is normal after surgery.  Elevate your leg when you are not up walking on it.   Continue to use the breathing machine you got in the hospital (incentive spirometer) which will help keep your temperature down.  It is common for your temperature to cycle up and down following surgery, especially at night when you are not up moving around and exerting yourself.  The breathing machine keeps your lungs expanded and your temperature down.  DIET:  As you were doing prior to hospitalization, we recommend a well-balanced diet.  DRESSING / WOUND CARE / SHOWERING:  Keep the surgical dressing until follow up.  The dressing is water  proof, so you can shower without any extra covering.  IF THE DRESSING FALLS OFF or the wound gets wet inside, change the dressing with sterile gauze.  Please use good hand washing techniques before changing the dressing.  Do not use any lotions or creams on the incision until instructed by your surgeon.    ACTIVITY  Increase activity slowly as tolerated, but follow the weight bearing instructions below.   No driving for 6  weeks or until further  direction given by your physician.  You cannot drive while taking narcotics.  No lifting or carrying greater than 10 lbs. until further directed by your surgeon. Avoid periods of inactivity such as sitting longer than an hour when not asleep. This helps prevent blood clots.  You may return to work once you are authorized by your doctor.   WEIGHT BEARING: Weight bearing as tolerated with assist device (walker, cane, etc) as directed, use it as long as suggested by your surgeon or therapist, typically at least 4-6 weeks.  EXERCISES  Results after joint replacement surgery are often greatly improved when you follow the exercise, range of motion and muscle strengthening exercises prescribed by your doctor. Safety measures are also important to protect the joint from further injury. Any time any of these exercises cause you to have increased pain or swelling, decrease what you are doing until you are comfortable again and then slowly increase them. If you have problems or questions, call your caregiver or physical therapist for advice.   Rehabilitation is important following a joint replacement. After just a few days of immobilization, the muscles of the leg can become weakened and shrink (atrophy).  These exercises are designed to build up the tone and strength of the thigh and leg muscles and to improve motion. Often times heat used for twenty to thirty minutes before working out will loosen up your tissues and help with improving the range of motion but do not use heat for the first two weeks following surgery (sometimes heat can increase post-operative swelling).   These exercises can be done on a training (exercise) mat, on the floor, on a table or on a bed. Use whatever works the best and is most comfortable for you.    Use music or television while you are exercising so that the exercises are a pleasant break in your day. This will make your life better with the exercises acting as a break in your  routine that you can look forward to.   Perform all exercises about fifteen times, three times per day or as directed.  You should exercise both the operative leg and the other leg as well.  Exercises include:   Quad Sets - Tighten up the muscle on the front of the thigh (Quad) and hold for 5-10 seconds.   Straight Leg Raises - With your knee straight (if you were given a brace, keep it on), lift the leg to 60 degrees, hold for 3 seconds, and slowly lower the leg.  Perform this exercise against resistance later as your leg gets stronger.  Leg Slides: Lying on your back, slowly slide your foot toward your buttocks, bending your knee up off the floor (only go as far as is comfortable). Then slowly slide your foot back down until your leg is flat on the floor again.  Angel Wings: Lying on your back spread your legs to the side as far apart as you can without causing discomfort.  Hamstring Strength:  Lying on your back, push your heel against the floor with your leg straight by tightening up the muscles of your buttocks.  Repeat, but this time bend your knee to a comfortable angle, and push your heel against the floor.  You may put a pillow under the heel to make it more comfortable if necessary.   A rehabilitation program following joint replacement surgery can speed recovery and prevent re-injury in the future due to weakened muscles. Contact your doctor  or a physical therapist for more information on knee rehabilitation.   CONSTIPATION:  Constipation is defined medically as fewer than three stools per week and severe constipation as less than one stool per week.  Even if you have a regular bowel pattern at home, your normal regimen is likely to be disrupted due to multiple reasons following surgery.  Combination of anesthesia, postoperative narcotics, change in appetite and fluid intake all can affect your bowels.   YOU MUST use at least one of the following options; they are listed in order of  increasing strength to get the job done.  They are all available over the counter, and you may need to use some, POSSIBLY even all of these options:    Drink plenty of fluids (prune juice may be helpful) and high fiber foods Colace 100 mg by mouth twice a day  Senokot for constipation as directed and as needed Dulcolax (bisacodyl ), take with full glass of water   Miralax  (polyethylene glycol) once or twice a day as needed.  If you have tried all these things and are unable to have a bowel movement in the first 3-4 days after surgery call either your surgeon or your primary doctor.    If you experience loose stools or diarrhea, hold the medications until you stool forms back up.  If your symptoms do not get better within 1 week or if they get worse, check with your doctor.  If you experience the worst abdominal pain ever or develop nausea or vomiting, please contact the office immediately for further recommendations for treatment.  ITCHING:  If you experience itching with your medications, try taking only a single pain pill, or even half a pain pill at a time.  You can also use Benadryl  over the counter for itching or also to help with sleep.   TED HOSE STOCKINGS:  Use stockings on both legs until for at least 2 weeks or as directed by physician office. They may be removed at night for sleeping.  MEDICATIONS:  See your medication summary on the After Visit Summary that nursing will review with you.  You may have some home medications which will be placed on hold until you complete the course of blood thinner medication.  It is important for you to complete the blood thinner medication as prescribed.  Blood clot prevention (DVT Prophylaxis): After surgery you are at an increased risk for a blood clot.  You were prescribed a blood thinner, Aspirin  81mg , to be taken twice daily for a total of 4 weeks from surgery to help reduce your risk of getting a blood clot.  Signs of a pulmonary embolus (blood  clot in the lungs) include sudden short of breath, feeling lightheaded or dizzy, chest pain with a deep breath, rapid pulse rapid breathing.  Signs of a blood clot in your arms or legs include new unexplained swelling and cramping, warm, red or darkened skin around the painful area.  Please call the office or 911 right away if these signs or symptoms develop.  PRECAUTIONS:   If you experience chest pain or shortness of breath - call 911 immediately for transfer to the hospital emergency department.   If you develop a fever greater that 101 F, purulent drainage from wound, increased redness or drainage from wound, foul odor from the wound/dressing, or calf pain - CONTACT YOUR SURGEON.  FOLLOW-UP APPOINTMENTS:  If you do not already have a post-op appointment, please call the office for an appointment to be seen by your surgeon.  Guidelines for how soon to be seen are listed in your After Visit Summary, but are typically between 2-3 weeks after surgery.  If you have a specialized bandage, you may be told to follow up 1 week after surgery.  OTHER INSTRUCTIONS:  Knee Replacement:  Do not place pillow under knee, focus on keeping the knee straight while resting.  Place foam block, curve side up under heel at all times except when walking.  DO NOT modify, tear, cut, or change the foam block in any way.  POST-OPERATIVE OPIOID TAPER INSTRUCTIONS: It is important to wean off of your opioid medication as soon as possible. If you do not need pain medication after your surgery it is ok to stop day one. Opioids include: Codeine, Hydrocodone(Norco, Vicodin), Oxycodone (Percocet, oxycontin ) and hydromorphone  amongst others.  Long term and even short term use of opiods can cause: Increased pain response Dependence Constipation Depression Respiratory depression And more.  Withdrawal symptoms can include Flu like symptoms Nausea, vomiting And more Techniques  to manage these symptoms Hydrate well Eat regular healthy meals Stay active Use relaxation techniques(deep breathing, meditating, yoga) Do Not substitute Alcohol  to help with tapering If you have been on opioids for less than two weeks and do not have pain than it is ok to stop all together.  Plan to wean off of opioids This plan should start within one week post op of your joint replacement. Maintain the same interval or time between taking each dose and first decrease the dose.  Cut the total daily intake of opioids by one tablet each day Next start to increase the time between doses. The last dose that should be eliminated is the evening dose.   MAKE SURE YOU:  Understand these instructions.  Get help right away if you are not doing well or get worse.    Thank you for letting us  be a part of your medical care team.  It is a privilege we respect greatly.  We hope these instructions will help you stay on track for a fast and full recovery!            Signed: Breya Cass A Demeco Ducksworth 02/16/2024, 7:25 AM

## 2024-02-17 MED ORDER — REPATHA SURECLICK 140 MG/ML ~~LOC~~ SOAJ: 140.0000 mg | SUBCUTANEOUS | 3 refills | Status: AC

## 2024-02-17 NOTE — Addendum Note (Signed)
 Addended by: Allix Blomquist K on: 02/17/2024 10:37 AM   Modules accepted: Orders

## 2024-02-17 NOTE — Telephone Encounter (Signed)
 Pt is asking that Rx be sent to  [id number] - [place], [place] - [address]  .
